# Patient Record
Sex: Female | Born: 1935 | Race: White | Hispanic: No | Smoking: Never smoker
Health system: Southern US, Community
[De-identification: ages and names within clinical notes are randomized; demographics above are authoritative.]

## PROBLEM LIST (undated history)

## (undated) DIAGNOSIS — I10 Essential (primary) hypertension: Secondary | ICD-10-CM

## (undated) DIAGNOSIS — E079 Disorder of thyroid, unspecified: Secondary | ICD-10-CM

## (undated) DIAGNOSIS — L0390 Cellulitis, unspecified: Secondary | ICD-10-CM

## (undated) DIAGNOSIS — N390 Urinary tract infection, site not specified: Secondary | ICD-10-CM

## (undated) DIAGNOSIS — H9190 Unspecified hearing loss, unspecified ear: Secondary | ICD-10-CM

## (undated) HISTORY — PX: CHOLECYSTECTOMY: SHX55

---

## 2015-09-02 DIAGNOSIS — R42 Dizziness and giddiness: Secondary | ICD-10-CM | POA: Diagnosis not present

## 2015-09-02 DIAGNOSIS — R2240 Localized swelling, mass and lump, unspecified lower limb: Secondary | ICD-10-CM | POA: Diagnosis not present

## 2015-09-02 DIAGNOSIS — R410 Disorientation, unspecified: Secondary | ICD-10-CM | POA: Diagnosis not present

## 2015-09-02 DIAGNOSIS — I10 Essential (primary) hypertension: Secondary | ICD-10-CM | POA: Diagnosis not present

## 2015-09-03 DIAGNOSIS — R079 Chest pain, unspecified: Secondary | ICD-10-CM | POA: Diagnosis not present

## 2015-09-03 DIAGNOSIS — Z809 Family history of malignant neoplasm, unspecified: Secondary | ICD-10-CM | POA: Diagnosis not present

## 2015-09-03 DIAGNOSIS — R791 Abnormal coagulation profile: Secondary | ICD-10-CM | POA: Diagnosis not present

## 2015-09-03 DIAGNOSIS — E785 Hyperlipidemia, unspecified: Secondary | ICD-10-CM | POA: Diagnosis not present

## 2015-09-03 DIAGNOSIS — I340 Nonrheumatic mitral (valve) insufficiency: Secondary | ICD-10-CM | POA: Diagnosis not present

## 2015-09-03 DIAGNOSIS — Z7189 Other specified counseling: Secondary | ICD-10-CM | POA: Diagnosis not present

## 2015-09-03 DIAGNOSIS — R531 Weakness: Secondary | ICD-10-CM | POA: Diagnosis not present

## 2015-09-03 DIAGNOSIS — G319 Degenerative disease of nervous system, unspecified: Secondary | ICD-10-CM | POA: Diagnosis not present

## 2015-09-03 DIAGNOSIS — E039 Hypothyroidism, unspecified: Secondary | ICD-10-CM | POA: Diagnosis not present

## 2015-09-03 DIAGNOSIS — I10 Essential (primary) hypertension: Secondary | ICD-10-CM | POA: Diagnosis not present

## 2015-09-03 DIAGNOSIS — M6281 Muscle weakness (generalized): Secondary | ICD-10-CM | POA: Diagnosis not present

## 2015-09-03 DIAGNOSIS — R55 Syncope and collapse: Secondary | ICD-10-CM | POA: Diagnosis not present

## 2015-09-03 DIAGNOSIS — R7989 Other specified abnormal findings of blood chemistry: Secondary | ICD-10-CM | POA: Diagnosis not present

## 2015-09-04 DIAGNOSIS — R55 Syncope and collapse: Secondary | ICD-10-CM | POA: Diagnosis not present

## 2015-09-04 DIAGNOSIS — I6523 Occlusion and stenosis of bilateral carotid arteries: Secondary | ICD-10-CM | POA: Diagnosis not present

## 2015-09-04 DIAGNOSIS — I10 Essential (primary) hypertension: Secondary | ICD-10-CM | POA: Diagnosis not present

## 2015-09-04 DIAGNOSIS — R7989 Other specified abnormal findings of blood chemistry: Secondary | ICD-10-CM | POA: Diagnosis not present

## 2015-09-04 DIAGNOSIS — Z0389 Encounter for observation for other suspected diseases and conditions ruled out: Secondary | ICD-10-CM | POA: Diagnosis not present

## 2015-09-05 DIAGNOSIS — I10 Essential (primary) hypertension: Secondary | ICD-10-CM | POA: Diagnosis not present

## 2015-09-05 DIAGNOSIS — E039 Hypothyroidism, unspecified: Secondary | ICD-10-CM | POA: Diagnosis not present

## 2015-09-05 DIAGNOSIS — R55 Syncope and collapse: Secondary | ICD-10-CM | POA: Diagnosis not present

## 2015-10-12 DIAGNOSIS — N390 Urinary tract infection, site not specified: Secondary | ICD-10-CM | POA: Diagnosis not present

## 2015-10-12 DIAGNOSIS — E039 Hypothyroidism, unspecified: Secondary | ICD-10-CM | POA: Diagnosis not present

## 2015-10-12 DIAGNOSIS — Z0000 Encounter for general adult medical examination without abnormal findings: Secondary | ICD-10-CM | POA: Diagnosis not present

## 2015-10-12 DIAGNOSIS — R739 Hyperglycemia, unspecified: Secondary | ICD-10-CM | POA: Diagnosis not present

## 2015-10-12 DIAGNOSIS — I10 Essential (primary) hypertension: Secondary | ICD-10-CM | POA: Diagnosis not present

## 2015-10-22 DIAGNOSIS — E079 Disorder of thyroid, unspecified: Secondary | ICD-10-CM | POA: Diagnosis not present

## 2015-10-22 DIAGNOSIS — E785 Hyperlipidemia, unspecified: Secondary | ICD-10-CM | POA: Diagnosis not present

## 2015-10-22 DIAGNOSIS — Z79899 Other long term (current) drug therapy: Secondary | ICD-10-CM | POA: Diagnosis not present

## 2015-10-22 DIAGNOSIS — J9811 Atelectasis: Secondary | ICD-10-CM | POA: Diagnosis not present

## 2015-10-22 DIAGNOSIS — R55 Syncope and collapse: Secondary | ICD-10-CM | POA: Diagnosis not present

## 2015-10-22 DIAGNOSIS — I10 Essential (primary) hypertension: Secondary | ICD-10-CM | POA: Diagnosis not present

## 2015-10-22 DIAGNOSIS — R42 Dizziness and giddiness: Secondary | ICD-10-CM | POA: Diagnosis not present

## 2015-11-15 DIAGNOSIS — I10 Essential (primary) hypertension: Secondary | ICD-10-CM | POA: Diagnosis not present

## 2015-11-15 DIAGNOSIS — Z23 Encounter for immunization: Secondary | ICD-10-CM | POA: Diagnosis not present

## 2015-11-22 DIAGNOSIS — I10 Essential (primary) hypertension: Secondary | ICD-10-CM | POA: Diagnosis not present

## 2015-11-22 DIAGNOSIS — E039 Hypothyroidism, unspecified: Secondary | ICD-10-CM | POA: Diagnosis not present

## 2016-07-05 DIAGNOSIS — L0390 Cellulitis, unspecified: Secondary | ICD-10-CM

## 2016-07-05 DIAGNOSIS — N390 Urinary tract infection, site not specified: Secondary | ICD-10-CM

## 2016-07-05 HISTORY — DX: Urinary tract infection, site not specified: N39.0

## 2016-07-05 HISTORY — DX: Cellulitis, unspecified: L03.90

## 2016-07-29 ENCOUNTER — Emergency Department (HOSPITAL_COMMUNITY): Payer: Medicare Other

## 2016-07-29 ENCOUNTER — Encounter (HOSPITAL_COMMUNITY): Payer: Self-pay | Admitting: *Deleted

## 2016-07-29 ENCOUNTER — Inpatient Hospital Stay (HOSPITAL_COMMUNITY)
Admission: EM | Admit: 2016-07-29 | Discharge: 2016-08-01 | DRG: 603 | Disposition: A | Discharge status: Home Health | Payer: Medicare Other | Attending: Oncology | Admitting: Oncology

## 2016-07-29 DIAGNOSIS — L0390 Cellulitis, unspecified: Secondary | ICD-10-CM

## 2016-07-29 DIAGNOSIS — N390 Urinary tract infection, site not specified: Secondary | ICD-10-CM | POA: Diagnosis present

## 2016-07-29 DIAGNOSIS — I10 Essential (primary) hypertension: Secondary | ICD-10-CM | POA: Diagnosis not present

## 2016-07-29 DIAGNOSIS — R0600 Dyspnea, unspecified: Secondary | ICD-10-CM | POA: Diagnosis not present

## 2016-07-29 DIAGNOSIS — Z79899 Other long term (current) drug therapy: Secondary | ICD-10-CM

## 2016-07-29 DIAGNOSIS — L03115 Cellulitis of right lower limb: Secondary | ICD-10-CM | POA: Diagnosis present

## 2016-07-29 DIAGNOSIS — R404 Transient alteration of awareness: Secondary | ICD-10-CM | POA: Diagnosis not present

## 2016-07-29 DIAGNOSIS — L03116 Cellulitis of left lower limb: Principal | ICD-10-CM | POA: Diagnosis present

## 2016-07-29 DIAGNOSIS — R600 Localized edema: Secondary | ICD-10-CM | POA: Diagnosis present

## 2016-07-29 DIAGNOSIS — R531 Weakness: Secondary | ICD-10-CM | POA: Diagnosis not present

## 2016-07-29 HISTORY — DX: Disorder of thyroid, unspecified: E07.9

## 2016-07-29 HISTORY — DX: Essential (primary) hypertension: I10

## 2016-07-29 HISTORY — DX: Cellulitis, unspecified: L03.90

## 2016-07-29 HISTORY — DX: Unspecified hearing loss, unspecified ear: H91.90

## 2016-07-29 HISTORY — DX: Urinary tract infection, site not specified: N39.0

## 2016-07-29 LAB — URINALYSIS, ROUTINE W REFLEX MICROSCOPIC
Bilirubin Urine: NEGATIVE
Glucose, UA: NEGATIVE mg/dL
Ketones, ur: NEGATIVE mg/dL
Nitrite: NEGATIVE
Protein, ur: 30 mg/dL — AB
Specific Gravity, Urine: 1.02 (ref 1.005–1.030)
pH: 5 (ref 5.0–8.0)

## 2016-07-29 LAB — COMPREHENSIVE METABOLIC PANEL
ALT: 7 U/L — ABNORMAL LOW (ref 14–54)
AST: 22 U/L (ref 15–41)
Albumin: 3.3 g/dL — ABNORMAL LOW (ref 3.5–5.0)
Alkaline Phosphatase: 49 U/L (ref 38–126)
Anion gap: 8 (ref 5–15)
BUN: 29 mg/dL — ABNORMAL HIGH (ref 6–20)
CO2: 21 mmol/L — ABNORMAL LOW (ref 22–32)
Calcium: 8.4 mg/dL — ABNORMAL LOW (ref 8.9–10.3)
Chloride: 105 mmol/L (ref 101–111)
Creatinine, Ser: 1.01 mg/dL — ABNORMAL HIGH (ref 0.44–1.00)
GFR calc Af Amer: 54 mL/min — ABNORMAL LOW (ref >60)
GFR calc non Af Amer: 47 mL/min — ABNORMAL LOW (ref >60)
Glucose, Bld: 109 mg/dL — ABNORMAL HIGH (ref 65–99)
Potassium: 4.1 mmol/L (ref 3.5–5.1)
Sodium: 134 mmol/L — ABNORMAL LOW (ref 135–145)
Total Bilirubin: 2 mg/dL — ABNORMAL HIGH (ref 0.3–1.2)
Total Protein: 5.9 g/dL — ABNORMAL LOW (ref 6.5–8.1)

## 2016-07-29 LAB — CBC WITH DIFFERENTIAL/PLATELET
Basophils Absolute: 0 10*3/uL (ref 0.0–0.1)
Basophils Relative: 0 %
Eosinophils Absolute: 0 10*3/uL (ref 0.0–0.7)
Eosinophils Relative: 0 %
HCT: 36.9 % (ref 36.0–46.0)
Hemoglobin: 12.6 g/dL (ref 12.0–15.0)
Lymphocytes Relative: 9 %
Lymphs Abs: 1.6 10*3/uL (ref 0.7–4.0)
MCH: 31 pg (ref 26.0–34.0)
MCHC: 34.1 g/dL (ref 30.0–36.0)
MCV: 90.7 fL (ref 78.0–100.0)
Monocytes Absolute: 0.8 10*3/uL (ref 0.1–1.0)
Monocytes Relative: 5 %
Neutro Abs: 15.1 10*3/uL — ABNORMAL HIGH (ref 1.7–7.7)
Neutrophils Relative %: 86 %
Platelets: 121 10*3/uL — ABNORMAL LOW (ref 150–400)
RBC: 4.07 MIL/uL (ref 3.87–5.11)
RDW: 13.6 % (ref 11.5–15.5)
WBC: 17.5 10*3/uL — ABNORMAL HIGH (ref 4.0–10.5)

## 2016-07-29 LAB — I-STAT CG4 LACTIC ACID, ED
Lactic Acid, Venous: 2.8 mmol/L (ref 0.5–1.9)
Lactic Acid, Venous: 2.28 mmol/L (ref 0.5–1.9)

## 2016-07-29 LAB — I-STAT TROPONIN, ED: Troponin i, poc: 0.01 ng/mL (ref 0.00–0.08)

## 2016-07-29 MED ORDER — ACETAMINOPHEN 650 MG RE SUPP: 650.0000 mg | Freq: Four times a day (QID) | RECTAL | Status: DC | PRN

## 2016-07-29 MED ORDER — SODIUM CHLORIDE 0.9 % IV SOLN
INTRAVENOUS | Status: DC
  Administered 2016-07-29: 21:00:00 via INTRAVENOUS

## 2016-07-29 MED ORDER — SODIUM CHLORIDE 0.9 % IV BOLUS (SEPSIS): 500.0000 mL | Freq: Once | INTRAVENOUS | Status: DC

## 2016-07-29 MED ORDER — AMLODIPINE BESYLATE 5 MG PO TABS
10.0000 mg | ORAL_TABLET | Freq: Every day | ORAL | Status: DC
Start: 2016-07-30 — End: 2016-08-01
  Administered 2016-07-30 – 2016-08-01 (×3): 10 mg via ORAL
  Filled 2016-07-29 (×3): qty 2

## 2016-07-29 MED ORDER — PIPERACILLIN-TAZOBACTAM 3.375 G IVPB: 3.3750 g | Freq: Three times a day (TID) | INTRAVENOUS | Status: DC

## 2016-07-29 MED ORDER — NEBIVOLOL HCL 5 MG PO TABS
5.0000 mg | ORAL_TABLET | Freq: Every day | ORAL | Status: DC
  Administered 2016-07-30 – 2016-08-01 (×3): 5 mg via ORAL
  Filled 2016-07-29 (×3): qty 2
  Filled 2016-07-29 (×3): qty 1

## 2016-07-29 MED ORDER — PIPERACILLIN-TAZOBACTAM 3.375 G IVPB 30 MIN
3.3750 g | Freq: Once | INTRAVENOUS | Status: AC
  Administered 2016-07-29: 3.375 g via INTRAVENOUS
  Filled 2016-07-29: qty 50

## 2016-07-29 MED ORDER — ACETAMINOPHEN 325 MG PO TABS
650.0000 mg | ORAL_TABLET | Freq: Once | ORAL | Status: AC
  Administered 2016-07-29: 650 mg via ORAL
  Filled 2016-07-29: qty 2

## 2016-07-29 MED ORDER — VANCOMYCIN HCL IN DEXTROSE 1-5 GM/200ML-% IV SOLN
1000.0000 mg | Freq: Once | INTRAVENOUS | Status: DC
Start: 2016-07-29 — End: 2016-07-29

## 2016-07-29 MED ORDER — ACETAMINOPHEN 325 MG PO TABS: 650.0000 mg | ORAL_TABLET | Freq: Four times a day (QID) | ORAL | Status: DC | PRN

## 2016-07-29 MED ORDER — VANCOMYCIN HCL IN DEXTROSE 750-5 MG/150ML-% IV SOLN: 750.0000 mg | INTRAVENOUS | Status: DC

## 2016-07-29 MED ORDER — VANCOMYCIN HCL 10 G IV SOLR
1500.0000 mg | Freq: Once | INTRAVENOUS | Status: AC
  Administered 2016-07-29: 1500 mg via INTRAVENOUS
  Filled 2016-07-29 (×2): qty 1500

## 2016-07-29 MED ORDER — SODIUM CHLORIDE 0.9 % IV BOLUS (SEPSIS)
1000.0000 mL | Freq: Once | INTRAVENOUS | Status: AC
  Administered 2016-07-29: 1000 mL via INTRAVENOUS

## 2016-07-29 MED ORDER — ENOXAPARIN SODIUM 40 MG/0.4ML ~~LOC~~ SOLN
40.0000 mg | SUBCUTANEOUS | Status: DC
  Administered 2016-07-29 – 2016-07-31 (×3): 40 mg via SUBCUTANEOUS
  Filled 2016-07-29 (×3): qty 0.4

## 2016-07-29 MED ORDER — DEXTROSE 5 % IV SOLN
1.0000 g | INTRAVENOUS | Status: DC
  Administered 2016-07-29 – 2016-07-30 (×2): 1 g via INTRAVENOUS
  Filled 2016-07-29 (×2): qty 10

## 2016-07-29 MED ORDER — SODIUM CHLORIDE 0.9 % IV SOLN
1000.0000 mL | INTRAVENOUS | Status: DC
  Administered 2016-07-29: 1000 mL via INTRAVENOUS

## 2016-07-29 NOTE — H&P (Signed)
Date: 07/29/2016               Patient Name:  Heidi Daniel MRN: [id number]  DOB: 10/29/1922 Age / Sex: 83 y.o., female   PCP: System, Pcp Not In         Medical Service: Internal Medicine Teaching Service         Attending Physician: Dr. Cyndie ChimeGranfortuna, Genene ChurnJames M, MD    First Contact: Dr. Noemi ChapelBethany Jourdyn Ferrin, DO Pager: [phone]  Second Contact: Dr. Darreld McleanVishal Patel, MD Pager: [phone]       After Hours (After 5p/  First Contact Pager: [phone]  weekends / holidays): Second Contact Pager: [phone]   Chief Complaint: fatigue, weakness  History of Present Illness: This is a very pleasant 83 y/o F with Mhx significant for HTN and chronic dependent LE edema who presents with her son for evaluation of fatigue. The patient reports she feels just fine and is unsure why she is being admitted. She lives with her son who provided the remainder of the history. Son reports she was in her usual state of health until after church on Sunday. She seemed more drowsy than usual and he actually had to help her walk due to weakness. She was unable to get herself out of her recliner without assistance today, prompting him to bring her for evaluation. He notes his mother is in excellent health usually and is able to complete all of her ADLs. The patient herself has no complaints other than a little leg pain and denies any fatigue, chest pain, SOB, cough, abdominal pain, diarrhea, dysuria or increased frequency. Son has noticed increased frequency over the past day.  In the ED she was febrile to 101* per EDP however this has not been recorded. Pulse 86, BP 121/61 and she was saturating 97% on RA. CMET with sodium of 134, albumin of 3.3 and T bilirubin of 2.0. CBC with leukocytosis to 17,500 with neutrophilic predominance. Lactic acid 2.8 > 2.3. CXR without infiltrate or edema. UA with TNTC WBC, large leukocytes, moderate Hgb and rare bacteria. Code sepsis was called and she was subsequently given IV Vancomycin, Zosyn and 2.5L IVF  and admitted to IMTS.   Meds:  Current Meds  Medication Sig  . amLODipine (NORVASC) 10 MG tablet Take 10 mg by mouth daily.  Marland Kitchen. lisinopril (PRINIVIL,ZESTRIL) 20 MG tablet Take 20 mg by mouth daily.  . Multiple Vitamin (MULTIVITAMIN) capsule Take 1 capsule by mouth daily.  . nebivolol (BYSTOLIC) 5 MG tablet Take 5 mg by mouth daily.   Allergies: Allergies as of 07/29/2016  . (No Known Allergies)   Past Medical History:  Diagnosis Date  . Hypertension   . Thyroid disease    Family History: Denies any pertinent family history.   Social History: She has never smoked, drank or used recreational drugs. [place] She has an appointment to establish with a PCP here 6/27.  Review of Systems: A complete ROS was negative except as per HPI.   Physical Exam: Blood pressure (!) 117/50, pulse 76, temperature 98.8 F (37.1 C), temperature source Rectal, resp. rate 17, height 5\' 8"  (1.727 m), weight 175 lb (79.4 kg), SpO2 95 %. General: Very pleasant. Appears YOUNGER than stated age. In no acute distress. Resting comfortably in bed.  HENT: EOMI. No conjunctival injection, icterus or ptosis. Oropharynx clear, mucous membranes moist.  Cardiovascular: Regular rate and rhythm, no gallop or rub appreciated.  Pulmonary: CTA BL with normal WOB on  RA.  Abdomen: Soft, non-tender and non-distended. No guarding or rigidity. +bowel sounds.  Extremities: Equal BL LE edema, mildly pitting. LLE with erythema and increased warmth. No wounds or ulcerations. Non-tender. Well-healed scar medial right ankle.  Skin: Warm, dry. No cyanosis.  Neuro: Strength and sensation grossly intact.  Psych: Stoic. Mood normal and affect was mood congruent. Responds to questions appropriately.   EKG: Pending.  CXR: No acute cardiopulmonary process. Thoracic atherosclerosis   Assessment & Plan by Problem: Principal Problem:   UTI (urinary tract infection) Active Problems:   HTN (hypertension)    Cellulitis  #UTI #LLE Cellulitis, Moderate, non-purulent  This patient is NOT septic. She is HD stable, with normal recorded vital signs. She reportedly had a temp of 101* in ED per EDP however not charted. Leukocytosis to 17,500 and lactic acid initially of 2.8, all of which are natural responses to infection. UA strongly suggestive of infection and her son has noticed increased frequency over the past day. In addition, her LLE is erythematous and warm however without discrete wound, ulceration or lesion. This was just noticed 2-3 days ago per son. She is s/p IV Vancomycin and Zosyn in ED as well as 2.5L of IV fluids. We will de-escalate therapy.  -D/c Vanc + Zosyn -Start IV Ceftriaxone to cover both UTI + cellulitis -Decr maintenance fluids to 58mls/hr overnight -Tylenol PRN -Bcx pending -PT eval -CBC and BMET in am  #HTN BP a touch soft at 114/44. Will hold home Lisinopril 20 mg however will continue Amlodipine 10 mg and Nebivolol 5 mg daily.   #Chronic BL LE Dependent Edema Improved with elevation and compression stockings. Denies any changes. Denies history of HF. She had no TTP of BL LE or knees.  -Keep LE elevated -Strongly doubt DVT, could consider Dopplers  Diet: HH IVF: ns @ 34mls/hr Code status: FULL - this was discussed with both patient and son on admission.  DVT ppx: Lovenox  Dispo: Admit patient to Inpatient with expected length of stay greater than 2 midnights.  SignedNoemi Chapel, DO 07/29/2016, 4:42 PM  Pager: [phone]

## 2016-07-29 NOTE — Progress Notes (Signed)
Pharmacy Antibiotic Note Heidi Daniel is a 83 y.o. female admitted on 07/29/2016 with AMS. Pharmacy consulted to dose vancomycin and zosyn for cellulitis. Afebrile, WBC 17.5, and LA 2.28. SCr 1 for estimated CrCl ~ 35-40 mL/min.   Plan: -Vancomycin 1500 mg IV x1, then 750 mg IV q24hr -Zosyn 3.375g IV q8hr  -Monitor renal function, clinical picture, and cultures -[facility] and as needed (goal 10-15 mcg/mL) -F/u length of therapy    Temp (24hrs), Avg:99.1 F (37.3 C), Min:99.1 F (37.3 C), Max:99.1 F (37.3 C)   Recent Labs Lab 07/29/16 1408 07/29/16 1515 07/29/16 1554  WBC  --  17.5*  --   CREATININE  --  1.01*  --   LATICACIDVEN 2.80*  --  2.28*    Estimated Creatinine Clearance: 39.3 mL/min (A) (by C-G formula based on SCr of 1.01 mg/dL (H)).     Antimicrobials this admission: 6/25 vancomycin > 6/25 zosyn >  Dose adjustments this admission: N/A  Microbiology results: 6/25 blood cx:  York CeriseKatherine Cook, PharmD Pharmacy Resident  Pager [phone] 07/29/16 4:40 PM

## 2016-07-29 NOTE — Progress Notes (Signed)
Pharmacy Antibiotic Note Andi HenceGloria Hubbert is a 83 y.o. female admitted on 07/29/2016 with AMS. All labs pending. Weight estimated since unable to be obtained at the moment.   Plan: -Vancomycin 1500 mg IV x1 -Zosyn 3.375 g IV x1 -F/u SCr for additional maintenance doses    Temp (24hrs), Avg:99.1 F (37.3 C), Min:99.1 F (37.3 C), Max:99.1 F (37.3 C)  No results for input(s): WBC, CREATININE, LATICACIDVEN, VANCOTROUGH, VANCOPEAK, VANCORANDOM, GENTTROUGH, GENTPEAK, GENTRANDOM, TOBRATROUGH, TOBRAPEAK, TOBRARND, AMIKACINPEAK, AMIKACINTROU, AMIKACIN in the last 168 hours.  CrCl cannot be calculated (No order found.).     Antimicrobials this admission: 6/25 vancomycin > 6/25 zosyn >  Dose adjustments this admission: N/A  Microbiology results: 6/25 blood cx:   Baldemar FridayMasters, Welcome Fults M 07/29/2016 1:24 PM

## 2016-07-29 NOTE — ED Notes (Signed)
Elevated CG-4 reported to Dr. Pfieffer 

## 2016-07-29 NOTE — ED Notes (Signed)
Got patient undress on the monitor did vitals patient is resting waiting on provider

## 2016-07-29 NOTE — ED Notes (Signed)
Second RN at bedside attempting IV & blood draw, Pfeiffer, MD informed of need for CBC & CMP, lactic resulted as high & x 1 culture collected, pt difficult stick, MD to start US guided IV if no second access is available

## 2016-07-29 NOTE — ED Provider Notes (Signed)
[facility]-EMERGENCY DEPT Provider Note   CSN: [id number] Arrival date & time: 07/29/16  1159     History   Chief Complaint Chief Complaint  Patient presents with  . Weakness    HPI Heidi Daniel is a 83 y.o. female.  HPI Is 83 year female with baseline is in good health. Baseline she is alert and interactive. Her son reports that starting yesterday she had decreased activity and decreased oral intake. Patient denies any pain complaints. She does not identify any specific problems. Her son however notes that she has not been as alert and active. On exam, notably patient does have lower extremity erythema left lower. This had not been specifically noted by family but patient seems to think it's been present for a while. Past Medical History:  Diagnosis Date  . Hypertension   . Thyroid disease     There are no active problems to display for this patient.   History reviewed. No pertinent surgical history.  OB History    No data available       Home Medications    Prior to Admission medications   Not on File    Family History No family history on file.  Social History Social History  Substance Use Topics  . Smoking status: Never Smoker  . Smokeless tobacco: Never Used  . Alcohol use No     Allergies   Patient has no known allergies.   Review of Systems Review of Systems 10 Systems reviewed and are negative for acute change except as noted in the HPI.   Physical Exam Updated Vital Signs BP (!) 117/50   Pulse 76   Temp 98.8 F (37.1 C) (Rectal)   Resp 17   Ht 5\' 8"  (1.727 m)   Wt 79.4 kg (175 lb)   SpO2 95%   BMI 26.61 kg/m   Physical Exam  Constitutional: She appears well-developed and well-nourished. No distress.  Alert and interactive. No respiratory distress  HENT:  Head: Normocephalic and atraumatic.  Mouth/Throat: Oropharynx is clear and moist.  Eyes: Conjunctivae and EOM are normal.  Neck: Neck supple.  Cardiovascular: Normal rate and  regular rhythm.   No murmur heard. Pulmonary/Chest: Effort normal and breath sounds normal. No respiratory distress.  Abdominal: Soft. There is no tenderness.  Musculoskeletal: She exhibits edema.  Left lower extremity erythema and edema  Neurological: She is alert. No cranial nerve deficit. She exhibits normal muscle tone. Coordination normal.  Skin: Skin is warm and dry.  Psychiatric: She has a normal mood and affect.  Nursing note and vitals reviewed.    ED Treatments / Results  Labs (all labs ordered are listed, but only abnormal results are displayed) Labs Reviewed  COMPREHENSIVE METABOLIC PANEL - Abnormal; Notable for the following:       Result Value   Sodium 134 (*)    CO2 21 (*)    Glucose, Bld 109 (*)    BUN 29 (*)    Creatinine, Ser 1.01 (*)    Calcium 8.4 (*)    Total Protein 5.9 (*)    Albumin 3.3 (*)    ALT 7 (*)    Total Bilirubin 2.0 (*)    GFR calc non Af Amer 47 (*)    GFR calc Af Amer 54 (*)    All other components within normal limits  CBC WITH DIFFERENTIAL/PLATELET - Abnormal; Notable for the following:    WBC 17.5 (*)    Platelets 121 (*)    Neutro Abs 15.1 (*)  All other components within normal limits  URINALYSIS, ROUTINE W REFLEX MICROSCOPIC - Abnormal; Notable for the following:    Color, Urine AMBER (*)    APPearance CLOUDY (*)    Hgb urine dipstick MODERATE (*)    Protein, ur 30 (*)    Leukocytes, UA LARGE (*)    Bacteria, UA RARE (*)    Squamous Epithelial / LPF 6-30 (*)    Non Squamous Epithelial 0-5 (*)    All other components within normal limits  I-STAT CG4 LACTIC ACID, ED - Abnormal; Notable for the following:    Lactic Acid, Venous 2.80 (*)    All other components within normal limits  I-STAT CG4 LACTIC ACID, ED - Abnormal; Notable for the following:    Lactic Acid, Venous 2.28 (*)    All other components within normal limits  CULTURE, BLOOD (ROUTINE X 2)  CULTURE, BLOOD (ROUTINE X 2)  I-STAT TROPOININ, ED    EKG  EKG  Interpretation None       Radiology Dg Chest 2 View  Result Date: 07/29/2016 CLINICAL DATA:  Several days of dyspnea and fever, several month history of lower abdominal and urinary bladder discomfort. History of hypertension and unspecified thyroid disease. EXAM: CHEST  2 VIEW COMPARISON:  None in PACs FINDINGS: The lungs are adequately inflated. There is no focal infiltrate. The interstitial markings are coarse. The heart is top-normal in size. The pulmonary vascularity is normal. There is calcification of the wall of the thoracic aorta. The bony thorax exhibits no acute abnormality. IMPRESSION: There is no acute cardiopulmonary abnormality. There are chronic bronchitic changes. There is thoracic aortic atherosclerosis. Electronically Signed   By: David  SwazilandJordan M.D.   On: 07/29/2016 14:20    Procedures Procedures (including critical care time)  Medications Ordered in ED Medications  0.9 %  sodium chloride infusion (1,000 mLs Intravenous New Bag/Given 07/29/16 1351)  vancomycin (VANCOCIN) 1,500 mg in sodium chloride 0.9 % 500 mL IVPB (1,500 mg Intravenous New Bag/Given 07/29/16 1504)  sodium chloride 0.9 % bolus 1,000 mL (not administered)    And  sodium chloride 0.9 % bolus 1,000 mL (not administered)    And  sodium chloride 0.9 % bolus 500 mL (not administered)  piperacillin-tazobactam (ZOSYN) IVPB 3.375 g (0 g Intravenous Stopped 07/29/16 1504)  acetaminophen (TYLENOL) tablet 650 mg (650 mg Oral Given 07/29/16 1424)     Initial Impression / Assessment and Plan / ED Course  I have reviewed the triage vital signs and the nursing notes.  Pertinent labs & imaging results that were available during my care of the patient were reviewed by me and considered in my medical decision making (see chart for details).     Consult: (14:40) internal medicine teaching service for admission.  Final Clinical Impressions(s) / ED Diagnoses   Final diagnoses:  UTI  Cellulitis    Patient is  elderly female but baseline is in good physical condition. She has had change in activity level with identified leukocytosis, UTI and cellulitis. I had nurses repeat temperatures patient felt warm to touch. Repeat temperature is 101. To start on empiric coverage for cellulitis and UTI with Zosyn and vancomycin. New Prescriptions New Prescriptions   No medications on file     Arby BarrettePfeiffer, Lyndall Bellot, MD 08/09/16 1434

## 2016-07-29 NOTE — ED Triage Notes (Signed)
[facility], per report pts son said she was not herself yesterday, did not eat lunch, & did not go to sleep as usual last night, pt denies pain, Alert to person & place, pt states, " I am 89." unable to state year, EMS reports pt was A&O x4 in route to hospital, pt follows commands, speaks in complete sentences, pt has multiple layers of clothes on upon arrival to ED

## 2016-07-29 NOTE — ED Notes (Signed)
X 2 RNs attempted IV access, IV team consult placed, phlebotomy at bedside attemting blood draw

## 2016-07-30 DIAGNOSIS — N390 Urinary tract infection, site not specified: Secondary | ICD-10-CM

## 2016-07-30 DIAGNOSIS — L03116 Cellulitis of left lower limb: Principal | ICD-10-CM

## 2016-07-30 DIAGNOSIS — I10 Essential (primary) hypertension: Secondary | ICD-10-CM

## 2016-07-30 LAB — BASIC METABOLIC PANEL
Anion gap: 5 (ref 5–15)
BUN: 19 mg/dL (ref 6–20)
CO2: 22 mmol/L (ref 22–32)
Calcium: 8.2 mg/dL — ABNORMAL LOW (ref 8.9–10.3)
Chloride: 110 mmol/L (ref 101–111)
Creatinine, Ser: 0.78 mg/dL (ref 0.44–1.00)
GFR calc Af Amer: >60 mL/min (ref >60)
GFR calc non Af Amer: >60 mL/min (ref >60)
Glucose, Bld: 146 mg/dL — ABNORMAL HIGH (ref 65–99)
Potassium: 3.8 mmol/L (ref 3.5–5.1)
Sodium: 137 mmol/L (ref 135–145)

## 2016-07-30 LAB — CBC
HCT: 37.3 % (ref 36.0–46.0)
Hemoglobin: 12.9 g/dL (ref 12.0–15.0)
MCH: 31.3 pg (ref 26.0–34.0)
MCHC: 34.6 g/dL (ref 30.0–36.0)
MCV: 90.5 fL (ref 78.0–100.0)
Platelets: 129 10*3/uL — ABNORMAL LOW (ref 150–400)
RBC: 4.12 MIL/uL (ref 3.87–5.11)
RDW: 13.4 % (ref 11.5–15.5)
WBC: 13.2 10*3/uL — ABNORMAL HIGH (ref 4.0–10.5)

## 2016-07-30 NOTE — Progress Notes (Signed)
   Subjective: Seen and evaluated today at bedside. Eating breakfast. Feels better. No complaints. Son present at bedside.   Objective:  Vital signs in last 24 hours: Vitals:   07/29/16 1908 07/29/16 2226 07/30/16 0630 07/30/16 0949  BP: (!) 115/46 99/75 (!) 120/52 (!) 133/57  Pulse: 65 68 70   Resp: 17 17 17    Temp: 98.6 F (37 C) 97.8 F (36.6 C) 99.4 F (37.4 C)   TempSrc: Oral Oral    SpO2: 96% 93% 95%   Weight:      Height:       General: Well-appearing elderly woman, appears younger than stated age. In no acute distress. Resting comfortably in bedside chair, eating breakfast. Pleasant and conversant.   Cardiovascular: Regular rate and rhythm. No murmur or rub appreciated. Pulmonary: CTA BL. Unlabored breathing.  Abdomen: Soft, non-tender and non-distended. No guarding or rigidity. +bowel sounds.  Extremities: BL LE edema, equal. Not pitting. LLE erythema improved.  Skin: Warm, dry. No cyanosis.  Psych: Mood normal and affect was mood congruent. Responds to questions appropriately.   Assessment/Plan:  Principal Problem:   UTI (urinary tract infection) Active Problems:   HTN (hypertension)   Cellulitis  #UTI #Cellulitis of LLE, Improving S/p IV Vancomycin and Zosyn from ED. Narrowed this to IV Ceftriaxone on admission. Leukocytosis improved today to 13.2. LEE with improved erythema. Her vital signs have remained stable however had reported fever of 101* last night in ED. Will keep patient on IV Abx until afebrile for at least 24 hours. Will be beneficial to get urine culture and sensitivities as well to further guide her outpatient therapy.  -IV Ceftriaxone -Bcx pending -Ucx not collected by ED. Have requested this as add-on vs new collection -Follow fever and WBC curves -FU PT eval -tylenol PRN fever  #HTN Holding lisinopril as she had soft BP on admission. Continue amlodipine 10 mg and Nebivolol 5 mg daily. -Consider restarting lisinopril should BP  increase  #Chronic BL LE Edema Chronic issue and has remained unchanged. No TTP of BL LE or knees. Denies hx of clots.  -Keep elevated -Doubt DVT as LLE erythema improving with Abx.   Dispo: Anticipated discharge in approximately 1 day(s).   Heidi Skilton, DO 07/30/2016, 10:35 AM Pager: [phone]

## 2016-07-30 NOTE — Evaluation (Signed)
Physical Therapy Evaluation Patient Details Name: Heidi Daniel MRN: [id number] DOB: 1923/01/29 Today's Date: 07/30/2016   History of Present Illness  83 y/o F with Mhx significant for HTN and chronic dependent LE edema who presents with her son for evaluation of fatigue. Admitted for LLE cellulitis and +UTI.  Clinical Impression  Orders received for PT evaluation. Patient demonstrates deficits in functional mobility as indicated below. Will benefit from continued skilled PT to address deficits and maximize function. Will see as indicated and progress as tolerated.  OF NOTE: patient session limited by fatigue, O2 saturations to 88% with increased activity however, unsure accuracy of pleth wave form. Patient denies SOB but does have audible breath sounds with exertion. HR stable     Follow Up Recommendations Home health PT;Supervision/Assistance - 24 hour (if unable to provide 24/7 will need to consider SNF)    Equipment Recommendations  Rolling walker with 5" wheels    Recommendations for Other Services       Precautions / Restrictions Precautions Precautions: Fall      Mobility  Bed Mobility               General bed mobility comments: received in chair  Transfers Overall transfer level: Needs assistance Equipment used: 1 person hand held assist;Rolling walker (2 wheeled) Transfers: Sit to/from [employer] Sit to Stand: Min assist Stand pivot transfers: Min assist       General transfer comment: Min assist for pivot to [facility] for safety and stability, min guard to elevate with RW.  Ambulation/Gait Ambulation/Gait assistance: Min guard Ambulation Distance (Feet): 24 Feet Assistive device: Rolling walker (2 wheeled) Gait Pattern/deviations: Step-to pattern;Decreased stride length;Shuffle;Antalgic;Trunk flexed Gait velocity: Decreased Gait velocity interpretation: <1.8 ft/sec, indicative of risk for recurrent falls General Gait Details: patient ambulated  with RW and min guard for safety. Cues for use of RW and positioning within device. Increased time and effort to perform in room ambulation. limited by fatigue  Stairs            Wheelchair Mobility    Modified Rankin (Stroke Patients Only)       Balance Overall balance assessment: Needs assistance   Sitting balance-Leahy Scale: Good       Standing balance-Leahy Scale: Poor Standing balance comment: heavy reliance on RW for Upright support                             Pertinent Vitals/Pain Pain Assessment: No/denies pain    Home Living Family/patient expects to be discharged to:: Private residence Living Arrangements: Children Available Help at Discharge: Family Type of Home: House Home Access: Ramped entrance     Home Layout: One level Home Equipment: Cane - single point      Prior Function Level of Independence: Independent               Hand Dominance   Dominant Hand: Right    Extremity/Trunk Assessment   Upper Extremity Assessment Upper Extremity Assessment: Generalized weakness    Lower Extremity Assessment Lower Extremity Assessment: Generalized weakness RLE Deficits / Details: 2/5 strength hip flexion, 3/5 knee flexion/ext RLE Coordination: decreased fine motor;decreased gross motor LLE Deficits / Details: increased LLE pitting edema noted, strength 2-/5 hip flexion, 3-/5 knee flexion LLE Coordination: decreased fine motor       Communication      Cognition Arousal/Alertness: Awake/alert Behavior During Therapy: WFL for tasks assessed/performed Overall Cognitive Status: Within Functional Limits for  tasks assessed Area of Impairment: Memory;Safety/judgement;Awareness                     Memory: Decreased short-term memory   Safety/Judgement: Decreased awareness of safety;Decreased awareness of deficits Awareness: Emergent          General Comments General comments (skin integrity, edema, etc.): hygiene and  pericare performed during session    Exercises     Assessment/Plan    PT Assessment Patient needs continued PT services  PT Problem List Decreased strength;Decreased activity tolerance;Decreased mobility;Decreased balance;Decreased coordination;Decreased cognition;Decreased knowledge of precautions;Cardiopulmonary status limiting activity       PT Treatment Interventions DME instruction;Gait training;Functional mobility training;Therapeutic activities;Therapeutic exercise;Balance training;Neuromuscular re-education;Patient/family education    PT Goals (Current goals can be found in the Care Plan section)  Acute Rehab PT Goals Patient Stated Goal: to go home PT Goal Formulation: With patient Time For Goal Achievement: 08/13/16 Potential to Achieve Goals: Good    Frequency Min 3X/week   Barriers to discharge        Co-evaluation               AM-PAC PT "6 Clicks" Daily Activity  Outcome Measure                  End of Session Equipment Utilized During Treatment: Gait belt Activity Tolerance: Patient limited by fatigue;Other (comment) (desaturation to 88% with activity) Patient left: in chair;with call bell/phone within reach;with chair alarm set Nurse Communication: Mobility status PT Visit Diagnosis: Unsteadiness on feet (R26.81);Muscle weakness (generalized) (M62.81)    Time: 1006-1030 PT Time Calculation (min) (ACUTE ONLY): 24 min   Charges:   PT Evaluation $PT Eval Moderate Complexity: 1 Procedure PT Treatments $Therapeutic Activity: 8-22 mins   PT G Codes:        Charlotte Crumbevon Matai Carpenito, PT DPT NCS [phone]   Fabio AsaDevon J Odile Veloso 07/30/2016, 10:38 AM

## 2016-07-31 ENCOUNTER — Encounter (HOSPITAL_COMMUNITY): Payer: Self-pay | Admitting: General Practice

## 2016-07-31 LAB — URINE CULTURE

## 2016-07-31 MED ORDER — WITCH HAZEL-GLYCERIN EX PADS
MEDICATED_PAD | CUTANEOUS | Status: DC | PRN
  Filled 2016-07-31: qty 100

## 2016-07-31 MED ORDER — CEFTRIAXONE SODIUM 1 G IJ SOLR
1.0000 g | INTRAMUSCULAR | Status: DC
  Administered 2016-07-31: 1 g via INTRAVENOUS
  Filled 2016-07-31 (×2): qty 10

## 2016-07-31 NOTE — Progress Notes (Signed)
Patient had scant blood on bedding, hemorrhoids noted on exam, tucks ordered.

## 2016-07-31 NOTE — Progress Notes (Signed)
   Subjective: Seen and evaluated today at bedside. She was resting comfortably and aroused easily. She feels very well and is ready for discharge. No fevers, chills, nausea, vomiting or abdominal pain. Open to HHPT.   Objective:  Vital signs in last 24 hours: Vitals:   07/30/16 0949 07/30/16 1529 07/30/16 2131 07/31/16 0444  BP: (!) 133/57 (!) 116/53 (!) 130/47 125/88  Pulse:  73 75 83  Resp:  18 18 18   Temp:  98.3 F (36.8 C) 98.8 F (37.1 C) 98.6 F (37 C)  TempSrc:  Oral Oral Oral  SpO2:  97% 94% 94%  Weight:      Height:       General: Well-appearing elderly woman, appears younger than stated age. In no acute distress. Resting comfortably. Pleasant and conversant.   Cardiovascular: Regular rate and rhythm. No murmur or rub appreciated. Pulmonary: CTA BL. Unlabored breathing.  Abdomen: Soft, NTND. No guarding or rigidity. +bowel sounds.  Extremities: BL LE edema, equal. LLE erythema with continued improvement. Skin: Warm, dry. No cyanosis.  Psych: Mood normal and affect was mood congruent. Responds to questions appropriately.   Assessment/Plan:  Principal Problem:   UTI (urinary tract infection) Active Problems:   HTN (hypertension)   Cellulitis  #UTI #Cellulitis of LLE, Improving S/p IV Vancomycin and Zosyn from ED. Narrowed this to IV Ceftriaxone on admission. Leukocytosis improved from 17.5 >> 13. LLE continues to improve and she denies any fevers, chills, nausea, vomiting or abdominal pain. She has been afebrile >24 hours. Bcx so far NGTD however ucx still pending -IV Ceftriaxone -Bcx NGTD -UCx pending, would appreciate C&S to narrow treatment for discharge -HH PT + rolling walker  -tylenol PRN fever  #HTN Held lisinopril as she had soft BP on admission. BP have normalized. Continue amlodipine 10 mg and Nebivolol 5 mg daily. -Consider restarting lisinopril should BP increase  #Chronic BL LE Edema No TTP of BL LE or knees. Denies hx of clots.  -Keep  elevated -Doubt DVT as LLE erythema improving with Abx.   Dispo: Anticipated discharge approximately today or tomorrow.   Tilman Mcclaren, DO 07/31/2016, 8:00 AM Pager: [phone]

## 2016-07-31 NOTE — Progress Notes (Signed)
Transitions of Care Pharmacy Note  Plan:  Educated on holding lisinopril 2/2 controlled HTN, follow up with MD outpatient  Addressed concerns regarding BP control Recommend continuing to hold lisinopril while inpatient and BP is <130/80 Outpatient follow-up restart lisinopril --------------------------------------------- Heidi Daniel is an 83 y.o. female who presents with a chief complaint of increased somnolence and decreased functional status. In anticipation of discharge, pharmacy has reviewed this patient's prior to admission medication history, as well as [facility]  Current medication indications, dosing, frequency, and notable side effects reviewed with patient and family. patient and family verbalized understanding of current inpatient medication regimen and are aware that the After Visit Summary when presented, will represent the most accurate medication list at discharge.   Heidi Daniel expressed concerns regarding need for BP medication and importance of BP control. Educated her on goals of care and mechanism of BP meds. Pt's son states that they have a pending appointment in late July with new PCP to establish care.    Assessment: Understanding of regimen: good Understanding of indications: good Potential of compliance: good Barriers to Obtaining Medications: No  Patient instructed to contact inpatient pharmacy team with further questions or concerns if needed.    Time spent preparing for discharge counseling: 10 mins Time spent counseling patient: 10 mins   Thank you for allowing pharmacy to be a part of this patient's care.  Allena Katzaroline E Tehila Sokolow, Pharm.D. PGY1 Pharmacy Resident 6/27/20186:13 PM Pager [phone]

## 2016-07-31 NOTE — Progress Notes (Signed)
Medicine attending: I examined this patient today and I concur with the evaluation and management plan as recorded by resident physician Dr. Toma CopierBethany Molt. She remains afebrile day #3 antibiotics for concomitant urinary tract infection and left lower extremity cellulitis.  Unfortunately, initial urine specimen was contaminated.  We will have to go by her clinical response to antibiotics and she does appear to be responding well.  Blood cultures with no growth at 48 hours. She will receive a third dose of Rocephin today then if otherwise stable transition to oral antibiotics and discharge tomorrow when blood cultures negative at 72 hours. Patient's son present.  Status and plan reviewed.

## 2016-08-01 MED ORDER — SULFAMETHOXAZOLE-TRIMETHOPRIM 400-80 MG PO TABS: 1.0000 | ORAL_TABLET | Freq: Two times a day (BID) | ORAL | 0 refills | Status: AC

## 2016-08-01 NOTE — Discharge Summary (Signed)
Name: Heidi Daniel MRN: [id number] DOB: 10/10/1922 83 y.o. PCP: System, Pcp Not In  Date of Admission: 07/29/2016 11:59 AM Date of Discharge: 08/01/2016 Attending Physician: Levert FeinsteinGranfortuna, James M, MD  Discharge Diagnosis: 1. Urinary tract infection 2. Cellulitis left lower extremity Principal Problem:   UTI (urinary tract infection) Active Problems:   HTN (hypertension)   Cellulitis   Discharge Medications: Allergies as of 08/01/2016   No Known Allergies     Medication List    TAKE these medications   amLODipine 10 MG tablet Commonly known as:  NORVASC Take 10 mg by mouth daily.   lisinopril 20 MG tablet Commonly known as:  PRINIVIL,ZESTRIL Take 20 mg by mouth daily.   multivitamin capsule Take 1 capsule by mouth daily.   nebivolol 5 MG tablet Commonly known as:  BYSTOLIC Take 5 mg by mouth daily.   sulfamethoxazole-trimethoprim 400-80 MG tablet Commonly known as:  BACTRIM Take 1 tablet by mouth 2 (two) times daily. Notes to patient:  Take every 12 hours            Durable Medical Equipment        Start     Ordered   07/31/16 1043  For home use only DME Walker rolling  Once    Question:  Patient needs a walker to treat with the following condition  Answer:  UTI (urinary tract infection)   07/31/16 1042      Disposition and follow-up:   [facility].  At the hospital follow up visit please address:  1.  Urinary tract infection, cellulitis: Ensure compliance and completion of her prescribed seven-day course of Bactrim as well as resolution of her symptoms of fatigue, weakness and left lower extremity erythema.  2.  Labs / imaging needed at time of follow-up: None  3.  Pending labs/ test needing follow-up: None  Follow-up Appointments: Follow-up Information    [facility] up.   Why:  home health services arranged, office to call and set up home  visits Contact information: [address] [place] [id number] [phone]           Hospital Course by problem list: Principal Problem:   UTI (urinary tract infection) Active Problems:   HTN (hypertension)   Cellulitis   Urinary tract infection Cellulitis Very pleasant 83 year old female in excellent health who presents with nonspecific symptoms of fatigue, weakness, lethargy and was found to have a urinary tract infection as well as cellulitis of her left lower extremity. Her leukocytosis was 17,500 and her initial temperature was 99.1 otherwise she was afebrile. She was initially given IV vancomycin and Zosyn by the provider however this was narrowed to Rocephin. Urine culture came back with multiple species and blood cultures remained without growth. She was discharged home with Bactrim to complete a ten-day course of therapy. She lives with her son who is very attentive and we will continue to be her primary caregiver at discharge. She seen by physical therapy who recommended home health physical therapy and this was arranged at time of discharge.  Discharge Vitals:   BP 137/71 (BP Location: Right Arm)   Pulse 78   Temp 98.1 F (36.7 C) (Oral)   Resp 20   Ht 5\' 9"  (1.753 m)   Wt 181 lb (82.1 kg)   SpO2 98%   BMI 26.73 kg/m   Pertinent Labs, Studies, and Procedures:  Urinalysis: Too numerous to count white  blood cells, large leukocytes, rare bacteria. Urine culture: Multiple species Blood cultures no growth to date  Signed: [name], DO 08/01/2016, 2:05 PM   Pager: [phone]

## 2016-08-01 NOTE — Care Management Note (Signed)
Case Management Note  Patient Details  Name: Heidi Daniel MRN: [id number] Date of Birth: 10/14/1922  Subjective/Objective:                    Action/Plan: Plan is to d/c to home with home health services.  Expected Discharge Date:  08/01/16               Expected Discharge Plan:  Home w Home Health Services (Resides with son, Onalee HuaDavid)  In-House Referral:     Discharge planning Services  CM Consult  Post Acute Care Choice:    Choice offered to:  Adult Children  DME Arranged:  Walker rolling DME Agency:  [facility]  [facility]  [facility]  Status of Service:  Completed, signed off  If discussed at Long Length of Stay Meetings, dates discussed:    Additional Comments:  Epifanio LeschesCole, Natara Monfort Hudson, RN 08/01/2016, 11:08 AM

## 2016-08-01 NOTE — Discharge Instructions (Signed)
Ms. Heidi Daniel, you were admitted for a urinary tract infection and an infection of your skin called cellulitis. We treated you with 3 days of IV antibiotics. We are discharging you home with another 7 days of oral antibiotics. Please take SEPTRA/BACTRIM twice daily for 7 days starting THE DAY OF DISCHARGE.   We have also ordered home health physical therapy to work with you in your home. They will call you with arrangements.

## 2016-08-01 NOTE — Progress Notes (Signed)
   Subjective: Seen and evaluated today at bedside. Looks well and is eager for discharge. No fevers or chills. Eating and drinking fine.   Objective:  Vital signs in last 24 hours: Vitals:   07/31/16 1044 07/31/16 1407 07/31/16 2102 08/01/16 0608  BP: (!) 130/56 136/63 (!) 131/59 137/71  Pulse: 82 79 73 78  Resp:  20 19 20   Temp:  98.5 F (36.9 C) 97.5 F (36.4 C) 98.1 F (36.7 C)  TempSrc:  Oral Oral Oral  SpO2: 98% 98% 96% 98%  Weight:      Height:       General: Well-appearing elderly woman, appears younger than stated age. In no acute distress. Resting comfortably in bedside chair. Pleasant and conversant.  Son at bedside.  Cardiovascular: Regular rate and rhythm. No murmur or rub appreciated. Pulmonary: CTA BL. Unlabored breathing.  Abdomen: Soft, NTND. +bowel sounds.  Extremities: BL LE edema, equal. LLE erythema with continued improvement. Skin: Warm, dry. No cyanosis.  Psych: Mood normal and affect was mood congruent. Responds to questions appropriately.   Assessment/Plan:  Principal Problem:   UTI (urinary tract infection) Active Problems:   HTN (hypertension)   Cellulitis  #UTI #Cellulitis of LLE, Improving S/p IV Vancomycin and Zosyn from ED which was narrowed to IV ceftriaxone on admission. She remains afebrile and feels well. She is also clinically improving as well. Feels ready for discharge. Will be discharged home with Bactrim to complete a 10 day course of antibiotics.  -DC with bactrim to complete a 10 day course -BCx no growth -Urine culture with multiple species -HH PT  #HTN Held lisinopril as she had soft BP on admission. BP have normalized. Continue amlodipine 10 mg and Nebivolol 5 mg daily. -Will resume her home regimen at discharge.   #Chronic BL LE Edema  -Keep elevated -Doubt DVT as LLE erythema improving with Abx.   Dispo: Anticipated discharge approximately today.   Aarush Stukey, DO 08/01/2016, 10:29 AM Pager: [phone]

## 2016-08-01 NOTE — Discharge Summary (Signed)
Medicine attending discharge note: I personally examined this patient on the day of discharge and I attest to the accuracy of the discharge evaluation and plan as recorded in the final progress note by resident physician Dr. Noemi ChapelBethany Molt.  Delightful 83 year old woman in overall excellent health who presents with nonspecific symptoms of fatigue, weakness, and lethargy and was found to have a urinary tract infection.  Examination also remarkable for chronic asymmetric peripheral edema 2-3+ left greater than right with an area of extensive cellulitis of the skin left tibial area.  Initial temperature 99.1 otherwise afebrile for the duration of the hospital course.  Initial white blood count 17,500 with 86% neutrophils. She was treated with a course of Rocephin.  Initial urine culture came back contaminated.  Blood cultures remained sterile.  Cellulitis was improving at time of discharge.  No other active issues identified at this time.  She has a very attentive son who was in the hospital daily and will continue to be her primary caregiver after discharge.  Disposition: Condition stable at time of discharge Continue an additional 6 days of oral Septra There were no complications

## 2016-08-01 NOTE — Progress Notes (Signed)
Physical Therapy Treatment Patient Details Name: Heidi Daniel MRN: [id number] DOB: [birth year] Today's Date: 08/01/2016    History of Present Illness 83 y/o F with Mhx significant for HTN and chronic dependent LE edema who presents with her son for evaluation of fatigue. Admitted for LLE cellulitis and +UTI.    PT Comments    Pt seen for mobility progression. Pt with improved tolerance to activity this session. Pt remains a high fall risk and required min A to recover from posterior LOB x2 during session. Pt's son was present throughout session and was able to see the amount of assistance his mother will need upon return home. Pt would continue to benefit from skilled physical therapy services at this time while admitted and after d/c to address the below listed limitations in order to improve overall safety and independence with functional mobility.   Follow Up Recommendations  Home health PT;Supervision/Assistance - 24 hour     Equipment Recommendations  Rolling walker with 5" wheels    Recommendations for Other Services       Precautions / Restrictions Precautions Precautions: Fall Restrictions Weight Bearing Restrictions: No    Mobility  Bed Mobility               General bed mobility comments: received in chair  Transfers Overall transfer level: Needs assistance Equipment used: Rolling walker (2 wheeled) Transfers: Sit to/from Stand Sit to Stand: Min assist         General transfer comment: increased time, vc'ing for hand placement, min A for stability with rise into standing from chair x1 and from [facility] x2. pt initially with posterior LOB upon standing both times.  Ambulation/Gait Ambulation/Gait assistance: Min guard Ambulation Distance (Feet): 40 Feet Assistive device: Rolling walker (2 wheeled) Gait Pattern/deviations: Step-to pattern;Decreased stride length;Shuffle;Trunk flexed Gait velocity: Decreased Gait velocity interpretation: <1.8 ft/sec, indicative  of risk for recurrent falls General Gait Details: pt with minimal to no foot clearance on L during swing phase, dragging foot to advance forwards   Stairs            Wheelchair Mobility    Modified Rankin (Stroke Patients Only)       Balance Overall balance assessment: Needs assistance Sitting-balance support: Feet supported Sitting balance-Leahy Scale: Good     Standing balance support: During functional activity;No upper extremity supported Standing balance-Leahy Scale: Poor Standing balance comment: reliance on RW for dynamic, min A to recovery from posterior LOB x2                             Cognition Arousal/Alertness: Awake/alert Behavior During Therapy: WFL for tasks assessed/performed Overall Cognitive Status: Within Functional Limits for tasks assessed                                        Exercises      General Comments        Pertinent Vitals/Pain Pain Assessment: No/denies pain    Home Living                      Prior Function            PT Goals (current goals can now be found in the care plan section) Acute Rehab PT Goals PT Goal Formulation: With patient Time For Goal Achievement: 08/13/16 Potential to Achieve Goals: Good Progress  towards PT goals: Progressing toward goals    Frequency    Min 3X/week      PT Plan Current plan remains appropriate    Co-evaluation              AM-PAC PT "6 Clicks" Daily Activity  Outcome Measure  Difficulty turning over in bed (including adjusting bedclothes, sheets and blankets)?: A Little Difficulty moving from lying on back to sitting on the side of the bed? : A Little Difficulty sitting down on and standing up from a chair with arms (e.g., wheelchair, bedside commode, etc,.)?: Total Help needed moving to and from a bed to chair (including a wheelchair)?: A Little Help needed walking in hospital room?: A Little Help needed climbing 3-5 steps  with a railing? : A Lot 6 Click Score: 15    End of Session Equipment Utilized During Treatment: Gait belt Activity Tolerance: Patient tolerated treatment well Patient left: in chair;with call bell/phone within reach;with chair alarm set;with family/visitor present Nurse Communication: Mobility status PT Visit Diagnosis: Unsteadiness on feet (R26.81);Muscle weakness (generalized) (M62.81)     Time: [id number] PT Time Calculation (min) (ACUTE ONLY): 27 min  Charges:  $Gait Training: 8-22 mins $Therapeutic Activity: 8-22 mins                    G Codes:       Alcan BorderJennifer Desman [place], [place], [place] [phone]    Alessandra BevelsJennifer M Hisako Bugh 08/01/2016, 11:49 AM

## 2016-08-03 LAB — CULTURE, BLOOD (ROUTINE X 2)
Culture: NO GROWTH
Culture: NO GROWTH
Special Requests: ADEQUATE
Special Requests: ADEQUATE

## 2016-08-05 DIAGNOSIS — R531 Weakness: Secondary | ICD-10-CM | POA: Diagnosis not present

## 2016-08-05 DIAGNOSIS — I10 Essential (primary) hypertension: Secondary | ICD-10-CM | POA: Diagnosis not present

## 2016-08-05 DIAGNOSIS — D72829 Elevated white blood cell count, unspecified: Secondary | ICD-10-CM | POA: Diagnosis not present

## 2016-08-05 DIAGNOSIS — L03116 Cellulitis of left lower limb: Secondary | ICD-10-CM | POA: Diagnosis not present

## 2016-08-05 DIAGNOSIS — E079 Disorder of thyroid, unspecified: Secondary | ICD-10-CM | POA: Diagnosis not present

## 2016-08-05 DIAGNOSIS — N390 Urinary tract infection, site not specified: Secondary | ICD-10-CM | POA: Diagnosis not present

## 2016-08-08 DIAGNOSIS — I10 Essential (primary) hypertension: Secondary | ICD-10-CM | POA: Diagnosis not present

## 2016-08-08 DIAGNOSIS — E079 Disorder of thyroid, unspecified: Secondary | ICD-10-CM | POA: Diagnosis not present

## 2016-08-08 DIAGNOSIS — L03116 Cellulitis of left lower limb: Secondary | ICD-10-CM | POA: Diagnosis not present

## 2016-08-08 DIAGNOSIS — R531 Weakness: Secondary | ICD-10-CM | POA: Diagnosis not present

## 2016-08-08 DIAGNOSIS — D72829 Elevated white blood cell count, unspecified: Secondary | ICD-10-CM | POA: Diagnosis not present

## 2016-08-08 DIAGNOSIS — N390 Urinary tract infection, site not specified: Secondary | ICD-10-CM | POA: Diagnosis not present

## 2016-08-09 DIAGNOSIS — L03116 Cellulitis of left lower limb: Secondary | ICD-10-CM | POA: Diagnosis not present

## 2016-08-09 DIAGNOSIS — R531 Weakness: Secondary | ICD-10-CM | POA: Diagnosis not present

## 2016-08-09 DIAGNOSIS — I10 Essential (primary) hypertension: Secondary | ICD-10-CM | POA: Diagnosis not present

## 2016-08-09 DIAGNOSIS — E079 Disorder of thyroid, unspecified: Secondary | ICD-10-CM | POA: Diagnosis not present

## 2016-08-09 DIAGNOSIS — D72829 Elevated white blood cell count, unspecified: Secondary | ICD-10-CM | POA: Diagnosis not present

## 2016-08-09 DIAGNOSIS — N390 Urinary tract infection, site not specified: Secondary | ICD-10-CM | POA: Diagnosis not present

## 2016-08-12 DIAGNOSIS — I10 Essential (primary) hypertension: Secondary | ICD-10-CM | POA: Diagnosis not present

## 2016-08-12 DIAGNOSIS — N390 Urinary tract infection, site not specified: Secondary | ICD-10-CM | POA: Diagnosis not present

## 2016-08-12 DIAGNOSIS — L03116 Cellulitis of left lower limb: Secondary | ICD-10-CM | POA: Diagnosis not present

## 2016-08-12 DIAGNOSIS — E079 Disorder of thyroid, unspecified: Secondary | ICD-10-CM | POA: Diagnosis not present

## 2016-08-12 DIAGNOSIS — R531 Weakness: Secondary | ICD-10-CM | POA: Diagnosis not present

## 2016-08-12 DIAGNOSIS — D72829 Elevated white blood cell count, unspecified: Secondary | ICD-10-CM | POA: Diagnosis not present

## 2016-08-14 DIAGNOSIS — R531 Weakness: Secondary | ICD-10-CM | POA: Diagnosis not present

## 2016-08-14 DIAGNOSIS — I10 Essential (primary) hypertension: Secondary | ICD-10-CM | POA: Diagnosis not present

## 2016-08-14 DIAGNOSIS — D72829 Elevated white blood cell count, unspecified: Secondary | ICD-10-CM | POA: Diagnosis not present

## 2016-08-14 DIAGNOSIS — L03116 Cellulitis of left lower limb: Secondary | ICD-10-CM | POA: Diagnosis not present

## 2016-08-14 DIAGNOSIS — E079 Disorder of thyroid, unspecified: Secondary | ICD-10-CM | POA: Diagnosis not present

## 2016-08-14 DIAGNOSIS — N390 Urinary tract infection, site not specified: Secondary | ICD-10-CM | POA: Diagnosis not present

## 2016-08-15 DIAGNOSIS — I10 Essential (primary) hypertension: Secondary | ICD-10-CM | POA: Diagnosis not present

## 2016-08-15 DIAGNOSIS — D72829 Elevated white blood cell count, unspecified: Secondary | ICD-10-CM | POA: Diagnosis not present

## 2016-08-15 DIAGNOSIS — R531 Weakness: Secondary | ICD-10-CM | POA: Diagnosis not present

## 2016-08-15 DIAGNOSIS — L03116 Cellulitis of left lower limb: Secondary | ICD-10-CM | POA: Diagnosis not present

## 2016-08-15 DIAGNOSIS — N390 Urinary tract infection, site not specified: Secondary | ICD-10-CM | POA: Diagnosis not present

## 2016-08-15 DIAGNOSIS — E079 Disorder of thyroid, unspecified: Secondary | ICD-10-CM | POA: Diagnosis not present

## 2016-08-16 DIAGNOSIS — D72829 Elevated white blood cell count, unspecified: Secondary | ICD-10-CM | POA: Diagnosis not present

## 2016-08-16 DIAGNOSIS — R531 Weakness: Secondary | ICD-10-CM | POA: Diagnosis not present

## 2016-08-16 DIAGNOSIS — L03116 Cellulitis of left lower limb: Secondary | ICD-10-CM | POA: Diagnosis not present

## 2016-08-16 DIAGNOSIS — N390 Urinary tract infection, site not specified: Secondary | ICD-10-CM | POA: Diagnosis not present

## 2016-08-16 DIAGNOSIS — I10 Essential (primary) hypertension: Secondary | ICD-10-CM | POA: Diagnosis not present

## 2016-08-16 DIAGNOSIS — E079 Disorder of thyroid, unspecified: Secondary | ICD-10-CM | POA: Diagnosis not present

## 2016-08-19 DIAGNOSIS — I10 Essential (primary) hypertension: Secondary | ICD-10-CM | POA: Diagnosis not present

## 2016-08-19 DIAGNOSIS — R531 Weakness: Secondary | ICD-10-CM | POA: Diagnosis not present

## 2016-08-19 DIAGNOSIS — N390 Urinary tract infection, site not specified: Secondary | ICD-10-CM | POA: Diagnosis not present

## 2016-08-19 DIAGNOSIS — E079 Disorder of thyroid, unspecified: Secondary | ICD-10-CM | POA: Diagnosis not present

## 2016-08-19 DIAGNOSIS — L03116 Cellulitis of left lower limb: Secondary | ICD-10-CM | POA: Diagnosis not present

## 2016-08-19 DIAGNOSIS — D72829 Elevated white blood cell count, unspecified: Secondary | ICD-10-CM | POA: Diagnosis not present

## 2016-08-20 DIAGNOSIS — E079 Disorder of thyroid, unspecified: Secondary | ICD-10-CM | POA: Diagnosis not present

## 2016-08-20 DIAGNOSIS — R531 Weakness: Secondary | ICD-10-CM | POA: Diagnosis not present

## 2016-08-20 DIAGNOSIS — I10 Essential (primary) hypertension: Secondary | ICD-10-CM | POA: Diagnosis not present

## 2016-08-20 DIAGNOSIS — L03116 Cellulitis of left lower limb: Secondary | ICD-10-CM | POA: Diagnosis not present

## 2016-08-20 DIAGNOSIS — N390 Urinary tract infection, site not specified: Secondary | ICD-10-CM | POA: Diagnosis not present

## 2016-08-20 DIAGNOSIS — D72829 Elevated white blood cell count, unspecified: Secondary | ICD-10-CM | POA: Diagnosis not present

## 2016-08-22 DIAGNOSIS — R531 Weakness: Secondary | ICD-10-CM | POA: Diagnosis not present

## 2016-08-22 DIAGNOSIS — L03116 Cellulitis of left lower limb: Secondary | ICD-10-CM | POA: Diagnosis not present

## 2016-08-22 DIAGNOSIS — I10 Essential (primary) hypertension: Secondary | ICD-10-CM | POA: Diagnosis not present

## 2016-08-22 DIAGNOSIS — N390 Urinary tract infection, site not specified: Secondary | ICD-10-CM | POA: Diagnosis not present

## 2016-08-22 DIAGNOSIS — D72829 Elevated white blood cell count, unspecified: Secondary | ICD-10-CM | POA: Diagnosis not present

## 2016-08-22 DIAGNOSIS — E079 Disorder of thyroid, unspecified: Secondary | ICD-10-CM | POA: Diagnosis not present

## 2016-08-28 DIAGNOSIS — R600 Localized edema: Secondary | ICD-10-CM | POA: Diagnosis not present

## 2016-08-28 DIAGNOSIS — R5383 Other fatigue: Secondary | ICD-10-CM | POA: Diagnosis not present

## 2016-08-28 DIAGNOSIS — N390 Urinary tract infection, site not specified: Secondary | ICD-10-CM | POA: Diagnosis not present

## 2016-08-28 DIAGNOSIS — I10 Essential (primary) hypertension: Secondary | ICD-10-CM | POA: Diagnosis not present

## 2016-08-29 DIAGNOSIS — E079 Disorder of thyroid, unspecified: Secondary | ICD-10-CM | POA: Diagnosis not present

## 2016-08-29 DIAGNOSIS — R531 Weakness: Secondary | ICD-10-CM | POA: Diagnosis not present

## 2016-08-29 DIAGNOSIS — D72829 Elevated white blood cell count, unspecified: Secondary | ICD-10-CM | POA: Diagnosis not present

## 2016-08-29 DIAGNOSIS — I10 Essential (primary) hypertension: Secondary | ICD-10-CM | POA: Diagnosis not present

## 2016-08-29 DIAGNOSIS — N390 Urinary tract infection, site not specified: Secondary | ICD-10-CM | POA: Diagnosis not present

## 2016-08-29 DIAGNOSIS — L03116 Cellulitis of left lower limb: Secondary | ICD-10-CM | POA: Diagnosis not present

## 2016-08-30 DIAGNOSIS — D72829 Elevated white blood cell count, unspecified: Secondary | ICD-10-CM | POA: Diagnosis not present

## 2016-08-30 DIAGNOSIS — I10 Essential (primary) hypertension: Secondary | ICD-10-CM | POA: Diagnosis not present

## 2016-08-30 DIAGNOSIS — E079 Disorder of thyroid, unspecified: Secondary | ICD-10-CM | POA: Diagnosis not present

## 2016-08-30 DIAGNOSIS — N390 Urinary tract infection, site not specified: Secondary | ICD-10-CM | POA: Diagnosis not present

## 2016-08-30 DIAGNOSIS — R531 Weakness: Secondary | ICD-10-CM | POA: Diagnosis not present

## 2016-08-30 DIAGNOSIS — L03116 Cellulitis of left lower limb: Secondary | ICD-10-CM | POA: Diagnosis not present

## 2016-09-03 DIAGNOSIS — N390 Urinary tract infection, site not specified: Secondary | ICD-10-CM | POA: Diagnosis not present

## 2016-09-03 DIAGNOSIS — E079 Disorder of thyroid, unspecified: Secondary | ICD-10-CM | POA: Diagnosis not present

## 2016-09-03 DIAGNOSIS — L03116 Cellulitis of left lower limb: Secondary | ICD-10-CM | POA: Diagnosis not present

## 2016-09-03 DIAGNOSIS — I10 Essential (primary) hypertension: Secondary | ICD-10-CM | POA: Diagnosis not present

## 2016-09-03 DIAGNOSIS — D72829 Elevated white blood cell count, unspecified: Secondary | ICD-10-CM | POA: Diagnosis not present

## 2016-09-03 DIAGNOSIS — R531 Weakness: Secondary | ICD-10-CM | POA: Diagnosis not present

## 2016-09-05 DIAGNOSIS — D72829 Elevated white blood cell count, unspecified: Secondary | ICD-10-CM | POA: Diagnosis not present

## 2016-09-05 DIAGNOSIS — I10 Essential (primary) hypertension: Secondary | ICD-10-CM | POA: Diagnosis not present

## 2016-09-05 DIAGNOSIS — N390 Urinary tract infection, site not specified: Secondary | ICD-10-CM | POA: Diagnosis not present

## 2016-09-05 DIAGNOSIS — E079 Disorder of thyroid, unspecified: Secondary | ICD-10-CM | POA: Diagnosis not present

## 2016-09-05 DIAGNOSIS — R531 Weakness: Secondary | ICD-10-CM | POA: Diagnosis not present

## 2016-09-05 DIAGNOSIS — L03116 Cellulitis of left lower limb: Secondary | ICD-10-CM | POA: Diagnosis not present

## 2017-02-05 DIAGNOSIS — R2689 Other abnormalities of gait and mobility: Secondary | ICD-10-CM | POA: Diagnosis not present

## 2017-02-05 DIAGNOSIS — I10 Essential (primary) hypertension: Secondary | ICD-10-CM | POA: Diagnosis not present

## 2017-02-05 DIAGNOSIS — R5383 Other fatigue: Secondary | ICD-10-CM | POA: Diagnosis not present

## 2017-02-05 DIAGNOSIS — Z23 Encounter for immunization: Secondary | ICD-10-CM | POA: Diagnosis not present

## 2017-02-05 DIAGNOSIS — R609 Edema, unspecified: Secondary | ICD-10-CM | POA: Diagnosis not present

## 2017-02-06 DIAGNOSIS — N390 Urinary tract infection, site not specified: Secondary | ICD-10-CM | POA: Diagnosis not present

## 2017-02-09 DIAGNOSIS — I10 Essential (primary) hypertension: Secondary | ICD-10-CM | POA: Diagnosis not present

## 2017-02-09 DIAGNOSIS — R609 Edema, unspecified: Secondary | ICD-10-CM | POA: Diagnosis not present

## 2017-02-09 DIAGNOSIS — N390 Urinary tract infection, site not specified: Secondary | ICD-10-CM | POA: Diagnosis not present

## 2017-02-09 DIAGNOSIS — R2689 Other abnormalities of gait and mobility: Secondary | ICD-10-CM | POA: Diagnosis not present

## 2017-02-10 DIAGNOSIS — N390 Urinary tract infection, site not specified: Secondary | ICD-10-CM | POA: Diagnosis not present

## 2017-02-10 DIAGNOSIS — I10 Essential (primary) hypertension: Secondary | ICD-10-CM | POA: Diagnosis not present

## 2017-02-10 DIAGNOSIS — R2689 Other abnormalities of gait and mobility: Secondary | ICD-10-CM | POA: Diagnosis not present

## 2017-02-10 DIAGNOSIS — R609 Edema, unspecified: Secondary | ICD-10-CM | POA: Diagnosis not present

## 2017-02-19 DIAGNOSIS — N390 Urinary tract infection, site not specified: Secondary | ICD-10-CM | POA: Diagnosis not present

## 2017-02-19 DIAGNOSIS — R2689 Other abnormalities of gait and mobility: Secondary | ICD-10-CM | POA: Diagnosis not present

## 2017-02-19 DIAGNOSIS — I10 Essential (primary) hypertension: Secondary | ICD-10-CM | POA: Diagnosis not present

## 2017-02-19 DIAGNOSIS — R609 Edema, unspecified: Secondary | ICD-10-CM | POA: Diagnosis not present

## 2017-02-21 DIAGNOSIS — R609 Edema, unspecified: Secondary | ICD-10-CM | POA: Diagnosis not present

## 2017-02-21 DIAGNOSIS — N390 Urinary tract infection, site not specified: Secondary | ICD-10-CM | POA: Diagnosis not present

## 2017-02-21 DIAGNOSIS — R2689 Other abnormalities of gait and mobility: Secondary | ICD-10-CM | POA: Diagnosis not present

## 2017-02-21 DIAGNOSIS — I10 Essential (primary) hypertension: Secondary | ICD-10-CM | POA: Diagnosis not present

## 2017-02-26 DIAGNOSIS — R2689 Other abnormalities of gait and mobility: Secondary | ICD-10-CM | POA: Diagnosis not present

## 2017-02-26 DIAGNOSIS — I10 Essential (primary) hypertension: Secondary | ICD-10-CM | POA: Diagnosis not present

## 2017-02-26 DIAGNOSIS — N390 Urinary tract infection, site not specified: Secondary | ICD-10-CM | POA: Diagnosis not present

## 2017-02-26 DIAGNOSIS — R609 Edema, unspecified: Secondary | ICD-10-CM | POA: Diagnosis not present

## 2017-02-28 DIAGNOSIS — I10 Essential (primary) hypertension: Secondary | ICD-10-CM | POA: Diagnosis not present

## 2017-02-28 DIAGNOSIS — R2689 Other abnormalities of gait and mobility: Secondary | ICD-10-CM | POA: Diagnosis not present

## 2017-02-28 DIAGNOSIS — N390 Urinary tract infection, site not specified: Secondary | ICD-10-CM | POA: Diagnosis not present

## 2017-02-28 DIAGNOSIS — R609 Edema, unspecified: Secondary | ICD-10-CM | POA: Diagnosis not present

## 2017-03-05 DIAGNOSIS — I10 Essential (primary) hypertension: Secondary | ICD-10-CM | POA: Diagnosis not present

## 2017-03-05 DIAGNOSIS — R609 Edema, unspecified: Secondary | ICD-10-CM | POA: Diagnosis not present

## 2017-03-05 DIAGNOSIS — N390 Urinary tract infection, site not specified: Secondary | ICD-10-CM | POA: Diagnosis not present

## 2017-03-05 DIAGNOSIS — R2689 Other abnormalities of gait and mobility: Secondary | ICD-10-CM | POA: Diagnosis not present

## 2017-03-06 DIAGNOSIS — R609 Edema, unspecified: Secondary | ICD-10-CM | POA: Diagnosis not present

## 2017-03-06 DIAGNOSIS — I10 Essential (primary) hypertension: Secondary | ICD-10-CM | POA: Diagnosis not present

## 2017-03-06 DIAGNOSIS — R2689 Other abnormalities of gait and mobility: Secondary | ICD-10-CM | POA: Diagnosis not present

## 2017-03-06 DIAGNOSIS — N390 Urinary tract infection, site not specified: Secondary | ICD-10-CM | POA: Diagnosis not present

## 2017-03-12 DIAGNOSIS — R609 Edema, unspecified: Secondary | ICD-10-CM | POA: Diagnosis not present

## 2017-03-12 DIAGNOSIS — I10 Essential (primary) hypertension: Secondary | ICD-10-CM | POA: Diagnosis not present

## 2017-03-12 DIAGNOSIS — R2689 Other abnormalities of gait and mobility: Secondary | ICD-10-CM | POA: Diagnosis not present

## 2017-03-12 DIAGNOSIS — N390 Urinary tract infection, site not specified: Secondary | ICD-10-CM | POA: Diagnosis not present

## 2017-03-13 DIAGNOSIS — R609 Edema, unspecified: Secondary | ICD-10-CM | POA: Diagnosis not present

## 2017-03-13 DIAGNOSIS — N390 Urinary tract infection, site not specified: Secondary | ICD-10-CM | POA: Diagnosis not present

## 2017-03-13 DIAGNOSIS — R2689 Other abnormalities of gait and mobility: Secondary | ICD-10-CM | POA: Diagnosis not present

## 2017-03-13 DIAGNOSIS — I10 Essential (primary) hypertension: Secondary | ICD-10-CM | POA: Diagnosis not present

## 2017-03-19 DIAGNOSIS — R609 Edema, unspecified: Secondary | ICD-10-CM | POA: Diagnosis not present

## 2017-03-19 DIAGNOSIS — I10 Essential (primary) hypertension: Secondary | ICD-10-CM | POA: Diagnosis not present

## 2017-03-19 DIAGNOSIS — N390 Urinary tract infection, site not specified: Secondary | ICD-10-CM | POA: Diagnosis not present

## 2017-03-19 DIAGNOSIS — R2689 Other abnormalities of gait and mobility: Secondary | ICD-10-CM | POA: Diagnosis not present

## 2017-03-20 DIAGNOSIS — R609 Edema, unspecified: Secondary | ICD-10-CM | POA: Diagnosis not present

## 2017-03-20 DIAGNOSIS — R2689 Other abnormalities of gait and mobility: Secondary | ICD-10-CM | POA: Diagnosis not present

## 2017-03-20 DIAGNOSIS — N390 Urinary tract infection, site not specified: Secondary | ICD-10-CM | POA: Diagnosis not present

## 2017-03-20 DIAGNOSIS — I10 Essential (primary) hypertension: Secondary | ICD-10-CM | POA: Diagnosis not present

## 2017-03-26 DIAGNOSIS — R2689 Other abnormalities of gait and mobility: Secondary | ICD-10-CM | POA: Diagnosis not present

## 2017-03-26 DIAGNOSIS — I10 Essential (primary) hypertension: Secondary | ICD-10-CM | POA: Diagnosis not present

## 2017-03-26 DIAGNOSIS — R609 Edema, unspecified: Secondary | ICD-10-CM | POA: Diagnosis not present

## 2017-03-26 DIAGNOSIS — N390 Urinary tract infection, site not specified: Secondary | ICD-10-CM | POA: Diagnosis not present

## 2017-03-27 DIAGNOSIS — N390 Urinary tract infection, site not specified: Secondary | ICD-10-CM | POA: Diagnosis not present

## 2017-03-27 DIAGNOSIS — I10 Essential (primary) hypertension: Secondary | ICD-10-CM | POA: Diagnosis not present

## 2017-03-27 DIAGNOSIS — R2689 Other abnormalities of gait and mobility: Secondary | ICD-10-CM | POA: Diagnosis not present

## 2017-03-27 DIAGNOSIS — R609 Edema, unspecified: Secondary | ICD-10-CM | POA: Diagnosis not present

## 2017-04-01 DIAGNOSIS — R600 Localized edema: Secondary | ICD-10-CM | POA: Diagnosis not present

## 2017-04-01 DIAGNOSIS — I10 Essential (primary) hypertension: Secondary | ICD-10-CM | POA: Diagnosis not present

## 2017-04-02 DIAGNOSIS — I10 Essential (primary) hypertension: Secondary | ICD-10-CM | POA: Diagnosis not present

## 2017-04-02 DIAGNOSIS — N390 Urinary tract infection, site not specified: Secondary | ICD-10-CM | POA: Diagnosis not present

## 2017-04-02 DIAGNOSIS — R2689 Other abnormalities of gait and mobility: Secondary | ICD-10-CM | POA: Diagnosis not present

## 2017-04-02 DIAGNOSIS — R609 Edema, unspecified: Secondary | ICD-10-CM | POA: Diagnosis not present

## 2017-04-03 DIAGNOSIS — I10 Essential (primary) hypertension: Secondary | ICD-10-CM | POA: Diagnosis not present

## 2017-04-03 DIAGNOSIS — N390 Urinary tract infection, site not specified: Secondary | ICD-10-CM | POA: Diagnosis not present

## 2017-04-03 DIAGNOSIS — R609 Edema, unspecified: Secondary | ICD-10-CM | POA: Diagnosis not present

## 2017-04-03 DIAGNOSIS — R2689 Other abnormalities of gait and mobility: Secondary | ICD-10-CM | POA: Diagnosis not present

## 2017-04-09 DIAGNOSIS — R609 Edema, unspecified: Secondary | ICD-10-CM | POA: Diagnosis not present

## 2017-04-09 DIAGNOSIS — R2689 Other abnormalities of gait and mobility: Secondary | ICD-10-CM | POA: Diagnosis not present

## 2017-04-09 DIAGNOSIS — I10 Essential (primary) hypertension: Secondary | ICD-10-CM | POA: Diagnosis not present

## 2017-04-09 DIAGNOSIS — N390 Urinary tract infection, site not specified: Secondary | ICD-10-CM | POA: Diagnosis not present

## 2017-04-25 DIAGNOSIS — I10 Essential (primary) hypertension: Secondary | ICD-10-CM | POA: Diagnosis not present

## 2017-04-25 DIAGNOSIS — R600 Localized edema: Secondary | ICD-10-CM | POA: Diagnosis not present

## 2017-08-05 DIAGNOSIS — R197 Diarrhea, unspecified: Secondary | ICD-10-CM | POA: Diagnosis not present

## 2017-08-05 DIAGNOSIS — I10 Essential (primary) hypertension: Secondary | ICD-10-CM | POA: Diagnosis not present

## 2017-08-06 DIAGNOSIS — R197 Diarrhea, unspecified: Secondary | ICD-10-CM | POA: Diagnosis not present

## 2017-09-22 DIAGNOSIS — R100 Acute abdomen: Secondary | ICD-10-CM | POA: Diagnosis not present

## 2017-09-22 DIAGNOSIS — R2689 Other abnormalities of gait and mobility: Secondary | ICD-10-CM | POA: Diagnosis not present

## 2017-09-22 DIAGNOSIS — R609 Edema, unspecified: Secondary | ICD-10-CM | POA: Diagnosis not present

## 2018-03-06 DIAGNOSIS — R05 Cough: Secondary | ICD-10-CM | POA: Diagnosis not present

## 2018-03-06 DIAGNOSIS — I10 Essential (primary) hypertension: Secondary | ICD-10-CM | POA: Diagnosis not present

## 2018-03-06 DIAGNOSIS — J988 Other specified respiratory disorders: Secondary | ICD-10-CM | POA: Diagnosis not present

## 2018-03-20 DIAGNOSIS — Z23 Encounter for immunization: Secondary | ICD-10-CM | POA: Diagnosis not present

## 2018-03-20 DIAGNOSIS — J189 Pneumonia, unspecified organism: Secondary | ICD-10-CM | POA: Diagnosis not present

## 2018-04-17 DIAGNOSIS — I10 Essential (primary) hypertension: Secondary | ICD-10-CM | POA: Diagnosis not present

## 2018-04-17 DIAGNOSIS — Z0000 Encounter for general adult medical examination without abnormal findings: Secondary | ICD-10-CM | POA: Diagnosis not present

## 2018-04-17 DIAGNOSIS — J181 Lobar pneumonia, unspecified organism: Secondary | ICD-10-CM | POA: Diagnosis not present

## 2018-04-17 DIAGNOSIS — R5383 Other fatigue: Secondary | ICD-10-CM | POA: Diagnosis not present

## 2018-04-17 DIAGNOSIS — J189 Pneumonia, unspecified organism: Secondary | ICD-10-CM | POA: Diagnosis not present

## 2018-11-25 DIAGNOSIS — R5383 Other fatigue: Secondary | ICD-10-CM | POA: Diagnosis not present

## 2018-11-25 DIAGNOSIS — R600 Localized edema: Secondary | ICD-10-CM | POA: Diagnosis not present

## 2018-11-25 DIAGNOSIS — I10 Essential (primary) hypertension: Secondary | ICD-10-CM | POA: Diagnosis not present

## 2019-02-02 DIAGNOSIS — I10 Essential (primary) hypertension: Secondary | ICD-10-CM | POA: Diagnosis not present

## 2019-02-03 DIAGNOSIS — I10 Essential (primary) hypertension: Secondary | ICD-10-CM | POA: Diagnosis not present

## 2019-02-18 DIAGNOSIS — I10 Essential (primary) hypertension: Secondary | ICD-10-CM | POA: Diagnosis not present

## 2019-02-21 DIAGNOSIS — I10 Essential (primary) hypertension: Secondary | ICD-10-CM | POA: Diagnosis not present

## 2019-02-22 DIAGNOSIS — N390 Urinary tract infection, site not specified: Secondary | ICD-10-CM | POA: Diagnosis not present

## 2019-02-24 DIAGNOSIS — I10 Essential (primary) hypertension: Secondary | ICD-10-CM | POA: Diagnosis not present

## 2019-02-26 DIAGNOSIS — I10 Essential (primary) hypertension: Secondary | ICD-10-CM | POA: Diagnosis not present

## 2019-03-02 DIAGNOSIS — I10 Essential (primary) hypertension: Secondary | ICD-10-CM | POA: Diagnosis not present

## 2019-03-04 DIAGNOSIS — I10 Essential (primary) hypertension: Secondary | ICD-10-CM | POA: Diagnosis not present

## 2019-03-05 DIAGNOSIS — I10 Essential (primary) hypertension: Secondary | ICD-10-CM | POA: Diagnosis not present

## 2019-03-09 DIAGNOSIS — I10 Essential (primary) hypertension: Secondary | ICD-10-CM | POA: Diagnosis not present

## 2019-03-11 DIAGNOSIS — I10 Essential (primary) hypertension: Secondary | ICD-10-CM | POA: Diagnosis not present

## 2019-03-16 DIAGNOSIS — I10 Essential (primary) hypertension: Secondary | ICD-10-CM | POA: Diagnosis not present

## 2019-03-19 DIAGNOSIS — I10 Essential (primary) hypertension: Secondary | ICD-10-CM | POA: Diagnosis not present

## 2019-04-01 ENCOUNTER — Ambulatory Visit: Payer: Medicare Other | Attending: Internal Medicine

## 2019-04-01 DIAGNOSIS — Z23 Encounter for immunization: Secondary | ICD-10-CM

## 2019-04-01 NOTE — Progress Notes (Signed)
   Covid-19 Vaccination Clinic  Name:  Heidi Daniel    MRN: [id number] DOB: 01/03/1923  04/01/2019  [name] was observed post Covid-19 immunization for 15 minutes without incidence. She was provided with Vaccine Information Sheet and instruction to access the V-Safe system.   [name] was instructed to call 911 with any severe reactions post vaccine: Marland Kitchen Difficulty breathing  . Swelling of your face and throat  . A fast heartbeat  . A bad rash all over your body  . Dizziness and weakness    Immunizations Administered    Name Date Dose VIS Date Route   Pfizer COVID-19 Vaccine 04/01/2019  4:41 PM 0.3 mL 01/15/2019 Intramuscular   Manufacturer: [employer], [employer]   Lot: [id number]   NDC: [address]-0

## 2019-04-13 DIAGNOSIS — L089 Local infection of the skin and subcutaneous tissue, unspecified: Secondary | ICD-10-CM | POA: Diagnosis not present

## 2019-04-13 DIAGNOSIS — R600 Localized edema: Secondary | ICD-10-CM | POA: Diagnosis not present

## 2019-04-13 DIAGNOSIS — I10 Essential (primary) hypertension: Secondary | ICD-10-CM | POA: Diagnosis not present

## 2019-04-14 DIAGNOSIS — I10 Essential (primary) hypertension: Secondary | ICD-10-CM | POA: Diagnosis not present

## 2019-04-14 DIAGNOSIS — L089 Local infection of the skin and subcutaneous tissue, unspecified: Secondary | ICD-10-CM | POA: Diagnosis not present

## 2019-04-14 DIAGNOSIS — R600 Localized edema: Secondary | ICD-10-CM | POA: Diagnosis not present

## 2019-04-22 DIAGNOSIS — R600 Localized edema: Secondary | ICD-10-CM | POA: Diagnosis not present

## 2019-04-22 DIAGNOSIS — I10 Essential (primary) hypertension: Secondary | ICD-10-CM | POA: Diagnosis not present

## 2019-04-22 DIAGNOSIS — L089 Local infection of the skin and subcutaneous tissue, unspecified: Secondary | ICD-10-CM | POA: Diagnosis not present

## 2019-04-23 ENCOUNTER — Other Ambulatory Visit: Payer: Self-pay

## 2019-04-23 ENCOUNTER — Encounter: Payer: Self-pay | Admitting: Podiatry

## 2019-04-23 ENCOUNTER — Ambulatory Visit (INDEPENDENT_AMBULATORY_CARE_PROVIDER_SITE_OTHER): Payer: Medicare Other | Admitting: Podiatry

## 2019-04-23 DIAGNOSIS — M79675 Pain in left toe(s): Secondary | ICD-10-CM

## 2019-04-23 DIAGNOSIS — B351 Tinea unguium: Secondary | ICD-10-CM | POA: Diagnosis not present

## 2019-04-23 DIAGNOSIS — M79674 Pain in right toe(s): Secondary | ICD-10-CM | POA: Diagnosis not present

## 2019-04-23 NOTE — Progress Notes (Signed)
This patient presents  to my office for  foot care.  Patient says her nails are painful walking and wearing her shoes.  This patient is unable to cut nails herself since the patient cannot reach her nails.These nails are painful walking and wearing shoes.  This patient presents for foot care today.  General Appearance  Alert, conversant and in no acute stress.  Vascular  Dorsalis pedis  pulses are weakly  palpable  bilaterally. Posterior tibial pulses are absent  B/L Capillary return is within normal limits  bilaterally. Temperature is within normal limits  bilaterally.  Neurologic  Senn-Weinstein monofilament wire test absent   bilaterally. Muscle power within normal limits bilaterally.  Nails Thick disfigured discolored nails with subungual debris  from hallux to fifth toes bilaterally. No evidence of bacterial infection or drainage bilaterally.  Orthopedic  No limitations of motion  feet .  No crepitus or effusions noted.  No bony pathology or digital deformities noted.  HAV  B/L.    Skin  normotropic skin with no porokeratosis noted bilaterally.  No signs of infections or ulcers noted.     Onychomycosis  Pain in right toes  Pain in left toes  Consent was obtained for treatment procedures.   Mechanical debridement of nails 1-5  bilaterally performed with a nail nipper.  Filed with dremel without incident. No infection or ulcer.     Return office visit    3 months                  Told patient to return for periodic foot care and evaluation due to potential at risk complications.   Roger Fasnacht DPM  

## 2019-04-28 ENCOUNTER — Ambulatory Visit: Payer: Medicare Other | Attending: Internal Medicine

## 2019-04-28 DIAGNOSIS — Z23 Encounter for immunization: Secondary | ICD-10-CM

## 2019-04-28 NOTE — Progress Notes (Signed)
   Covid-19 Vaccination Clinic  Name:  Heidi Daniel    MRN: [id number] DOB: 10/16/1922  04/28/2019  Ms. Heidi Daniel was observed post Covid-19 immunization for 15 minutes without incident. She was provided with Vaccine Information Sheet and instruction to access the V-Safe system.   Ms. Heidi Daniel was instructed to call 911 with any severe reactions post vaccine: Marland Kitchen Difficulty breathing  . Swelling of face and throat  . A fast heartbeat  . A bad rash all over body  . Dizziness and weakness   Immunizations Administered    Name Date Dose VIS Date Route   Pfizer COVID-19 Vaccine 04/28/2019  1:45 PM 0.3 mL 01/15/2019 Intramuscular   Manufacturer: [employer], [employer]   Lot: [id number]   NDC: [address]-2

## 2019-05-14 DIAGNOSIS — G8929 Other chronic pain: Secondary | ICD-10-CM | POA: Diagnosis not present

## 2019-05-27 DIAGNOSIS — Z0000 Encounter for general adult medical examination without abnormal findings: Secondary | ICD-10-CM | POA: Diagnosis not present

## 2019-05-27 DIAGNOSIS — I10 Essential (primary) hypertension: Secondary | ICD-10-CM | POA: Diagnosis not present

## 2019-05-27 DIAGNOSIS — R5383 Other fatigue: Secondary | ICD-10-CM | POA: Diagnosis not present

## 2019-05-27 DIAGNOSIS — N390 Urinary tract infection, site not specified: Secondary | ICD-10-CM | POA: Diagnosis not present

## 2019-05-27 DIAGNOSIS — R600 Localized edema: Secondary | ICD-10-CM | POA: Diagnosis not present

## 2019-07-27 ENCOUNTER — Ambulatory Visit: Payer: BLUE CROSS/BLUE SHIELD | Admitting: Podiatry

## 2019-08-11 ENCOUNTER — Ambulatory Visit (INDEPENDENT_AMBULATORY_CARE_PROVIDER_SITE_OTHER): Payer: Medicare Other | Admitting: Podiatry

## 2019-08-11 ENCOUNTER — Encounter: Payer: Self-pay | Admitting: Podiatry

## 2019-08-11 ENCOUNTER — Other Ambulatory Visit: Payer: Self-pay

## 2019-08-11 DIAGNOSIS — M79675 Pain in left toe(s): Secondary | ICD-10-CM

## 2019-08-11 DIAGNOSIS — B351 Tinea unguium: Secondary | ICD-10-CM

## 2019-08-11 DIAGNOSIS — M79674 Pain in right toe(s): Secondary | ICD-10-CM | POA: Diagnosis not present

## 2019-08-11 NOTE — Progress Notes (Signed)
This patient presents  to my office for  foot care.  Patient says her nails are painful walking and wearing her shoes.  This patient is unable to cut nails herself since the patient cannot reach her nails.These nails are painful walking and wearing shoes.  This patient presents for foot care today.  General Appearance  Alert, conversant and in no acute stress.  Vascular  Dorsalis pedis  pulses are weakly  palpable  bilaterally. Posterior tibial pulses are absent  B/L Capillary return is within normal limits  bilaterally. Temperature is within normal limits  bilaterally.  Neurologic  Senn-Weinstein monofilament wire test absent   bilaterally. Muscle power within normal limits bilaterally.  Nails Thick disfigured discolored nails with subungual debris  from hallux to fifth toes bilaterally. No evidence of bacterial infection or drainage bilaterally.  Orthopedic  No limitations of motion  feet .  No crepitus or effusions noted.  No bony pathology or digital deformities noted.  HAV  B/L.    Skin  normotropic skin with no porokeratosis noted bilaterally.  No signs of infections or ulcers noted.     Onychomycosis  Pain in right toes  Pain in left toes  Consent was obtained for treatment procedures.   Mechanical debridement of nails 1-5  bilaterally performed with a nail nipper.  Filed with dremel without incident. No infection or ulcer.     Return office visit    3 months                  Told patient to return for periodic foot care and evaluation due to potential at risk complications.   Helane Gunther DPM

## 2019-10-12 ENCOUNTER — Ambulatory Visit: Payer: Medicare Other | Admitting: Podiatry

## 2019-11-24 ENCOUNTER — Ambulatory Visit: Payer: BLUE CROSS/BLUE SHIELD | Admitting: Podiatry

## 2019-12-15 DIAGNOSIS — I10 Essential (primary) hypertension: Secondary | ICD-10-CM | POA: Diagnosis not present

## 2019-12-15 DIAGNOSIS — R5383 Other fatigue: Secondary | ICD-10-CM | POA: Diagnosis not present

## 2019-12-15 DIAGNOSIS — R600 Localized edema: Secondary | ICD-10-CM | POA: Diagnosis not present

## 2020-02-17 DIAGNOSIS — R300 Dysuria: Secondary | ICD-10-CM | POA: Diagnosis not present

## 2020-02-21 ENCOUNTER — Other Ambulatory Visit: Payer: Self-pay

## 2020-02-21 ENCOUNTER — Encounter (HOSPITAL_COMMUNITY): Payer: Self-pay

## 2020-02-21 ENCOUNTER — Emergency Department (HOSPITAL_COMMUNITY)
Admission: EM | Admit: 2020-02-21 | Discharge: 2020-02-22 | Disposition: A | Discharge status: Home / Self Care | Payer: Medicare Other | Attending: Emergency Medicine | Admitting: Emergency Medicine

## 2020-02-21 DIAGNOSIS — M4802 Spinal stenosis, cervical region: Secondary | ICD-10-CM | POA: Diagnosis not present

## 2020-02-21 DIAGNOSIS — J3489 Other specified disorders of nose and nasal sinuses: Secondary | ICD-10-CM | POA: Diagnosis not present

## 2020-02-21 DIAGNOSIS — Y9301 Activity, walking, marching and hiking: Secondary | ICD-10-CM | POA: Insufficient documentation

## 2020-02-21 DIAGNOSIS — Z23 Encounter for immunization: Secondary | ICD-10-CM | POA: Diagnosis not present

## 2020-02-21 DIAGNOSIS — S0101XA Laceration without foreign body of scalp, initial encounter: Secondary | ICD-10-CM | POA: Diagnosis not present

## 2020-02-21 DIAGNOSIS — I10 Essential (primary) hypertension: Secondary | ICD-10-CM | POA: Diagnosis not present

## 2020-02-21 DIAGNOSIS — W010XXA Fall on same level from slipping, tripping and stumbling without subsequent striking against object, initial encounter: Secondary | ICD-10-CM | POA: Diagnosis not present

## 2020-02-21 DIAGNOSIS — S199XXA Unspecified injury of neck, initial encounter: Secondary | ICD-10-CM | POA: Diagnosis not present

## 2020-02-21 DIAGNOSIS — S0990XA Unspecified injury of head, initial encounter: Secondary | ICD-10-CM | POA: Diagnosis not present

## 2020-02-21 DIAGNOSIS — I700 Atherosclerosis of aorta: Secondary | ICD-10-CM | POA: Diagnosis not present

## 2020-02-21 DIAGNOSIS — H748X3 Other specified disorders of middle ear and mastoid, bilateral: Secondary | ICD-10-CM | POA: Diagnosis not present

## 2020-02-21 DIAGNOSIS — M47812 Spondylosis without myelopathy or radiculopathy, cervical region: Secondary | ICD-10-CM | POA: Diagnosis not present

## 2020-02-21 DIAGNOSIS — W19XXXA Unspecified fall, initial encounter: Secondary | ICD-10-CM | POA: Diagnosis not present

## 2020-02-21 DIAGNOSIS — Z79899 Other long term (current) drug therapy: Secondary | ICD-10-CM | POA: Diagnosis not present

## 2020-02-21 DIAGNOSIS — E041 Nontoxic single thyroid nodule: Secondary | ICD-10-CM | POA: Diagnosis not present

## 2020-02-21 MED ORDER — TETANUS-DIPHTH-ACELL PERTUSSIS 5-2.5-18.5 LF-MCG/0.5 IM SUSY
0.5000 mL | PREFILLED_SYRINGE | Freq: Once | INTRAMUSCULAR | Status: AC
  Administered 2020-02-21: 0.5 mL via INTRAMUSCULAR
  Filled 2020-02-21: qty 0.5

## 2020-02-21 NOTE — ED Triage Notes (Signed)
Pt BIB EMS, per EMS pt fell while walking to the bathroom with her walker. It was a witnessed fall, her son was there and states that she has fell a few times in the last few weeks. Pt has a small laceration to the back of her head, left side. Pt is A&O x 3.   Vitals 146/80 BP 82 Pulse 18 R 96% Room air

## 2020-02-21 NOTE — ED Provider Notes (Signed)
[facility]-EMERGENCY DEPT Provider Note   CSN: [id number] Arrival date & time: 02/21/20  2239     History Chief Complaint  Patient presents with  . Fall  . Head Laceration    Heidi Daniel is a 87 y.o. female.  HPI     This is a 86 year old female history of hypertension, thyroid disease, hard of hearing who presents following a fall.  Patient reports she tripped and fell walking to the bathroom.  Per EMS, fall was witnessed by her son.  Patient is only reporting injury to her head.  She denies pain elsewhere.  She was ambulatory after the fall per the patient.  She is not on any blood thinners.  She is awake, alert, oriented x3 for EMS.  Vital signs are reassuring.  No recent illnesses.  Past Medical History:  Diagnosis Date  . Cellulitis 07/2016   LOWER EXTREMITIES  . HOH (hard of hearing)   . Hypertension   . Thyroid disease   . UTI (urinary tract infection) 07/2016    Patient Active Problem List   Diagnosis Date Noted  . Pain due to onychomycosis of toenails of both feet 04/23/2019  . UTI (urinary tract infection) 07/29/2016  . HTN (hypertension) 07/29/2016  . Cellulitis 07/29/2016    Past Surgical History:  Procedure Laterality Date  . CHOLECYSTECTOMY       OB History   No obstetric history on file.     History reviewed. No pertinent family history.  Social History   Tobacco Use  . Smoking status: Never Smoker  . Smokeless tobacco: Never Used  Vaping Use  . Vaping Use: Never used  Substance Use Topics  . Alcohol use: No  . Drug use: No    Home Medications Prior to Admission medications   Medication Sig Start Date End Date Taking? Authorizing Provider  amLODipine (NORVASC) 5 MG tablet  06/11/19   [provider]  carvedilol (COREG) 6.25 MG tablet  08/05/19   [provider]  ciprofloxacin (CIPRO) 250 MG tablet  02/22/19   [provider]  lisinopril (PRINIVIL,ZESTRIL) 20 MG tablet Take 20 mg by mouth  daily.    [provider]  Multiple Vitamin (MULTIVITAMIN) capsule Take 1 capsule by mouth daily.    [provider]  nebivolol (BYSTOLIC) 5 MG tablet Take 5 mg by mouth daily.    [provider]  PROCTOZONE-HC 2.5 % rectal cream  02/22/19   [provider]    Allergies    Patient has no known allergies.  Review of Systems   Review of Systems  Constitutional: Negative for fever.  Respiratory: Negative for shortness of breath.   Cardiovascular: Negative for chest pain.  Gastrointestinal: Negative for abdominal pain, nausea and vomiting.  Genitourinary: Negative for dysuria.  Skin: Positive for wound.  Neurological: Negative for weakness.  All other systems reviewed and are negative.   Physical Exam Updated Vital Signs BP (!) 155/88   Pulse 80   Temp 99.5 F (37.5 C) (Oral)   Resp 18   Ht 1.753 m (5\' 9" )   Wt 82.1 kg   SpO2 97%   BMI 26.73 kg/m   Physical Exam Vitals and nursing note reviewed.  Constitutional:      Appearance: She is well-developed and well-nourished.     Comments: Elderly, chronically ill appearing, nontoxic  HENT:     Head: Normocephalic.     Comments: Small laceration with hematoma noted to the left occipital area, no active  bleeding, bruising right temporal region    Nose: Nose normal.     Mouth/Throat:     Mouth: Mucous membranes are moist.  Eyes:     Extraocular Movements: Extraocular movements intact.     Pupils: Pupils are equal, round, and reactive to light.  Neck:     Comments: C-collar in place Cardiovascular:     Rate and Rhythm: Normal rate and regular rhythm.     Heart sounds: Normal heart sounds.  Pulmonary:     Effort: Pulmonary effort is normal. No respiratory distress.     Breath sounds: No wheezing.  Abdominal:     General: Bowel sounds are normal.     Palpations: Abdomen is soft.     Tenderness: There is no guarding or rebound.  Musculoskeletal:        General: No tenderness or  deformity. Normal range of motion.     Comments: Normal range of motion, no obvious deformities  Skin:    General: Skin is warm and dry.  Neurological:     Mental Status: She is alert and oriented to person, place, and time.  Psychiatric:        Mood and Affect: Mood and affect and mood normal.     ED Results / Procedures / Treatments   Labs (all labs ordered are listed, but only abnormal results are displayed) Labs Reviewed - No data to display  EKG None  Radiology CT Head Wo Contrast  Result Date: 02/22/2020 CLINICAL DATA:  Fall walking to the bathroom, multiple falls recently, posterior scalp laceration EXAM: CT HEAD WITHOUT CONTRAST CT CERVICAL SPINE WITHOUT CONTRAST TECHNIQUE: Multidetector CT imaging of the head and cervical spine was performed following the standard protocol without intravenous contrast. Multiplanar CT image reconstructions of the cervical spine were also generated. COMPARISON:  None. FINDINGS: CT HEAD FINDINGS Brain: No evidence of acute infarction, hemorrhage, hydrocephalus, extra-axial collection, visible mass lesion or mass effect. Symmetric prominence of the ventricles, cisterns and sulci compatible with parenchymal volume loss. Patchy areas of white matter hypoattenuation are most compatible with chronic microvascular angiopathy. Vascular: Calcific atherosclerosis at the level of the skull base. No concerning hyperdense vessels. Skull: Left parieto-occipital scalp swelling with thin crescentic scalp hematoma measuring up to 6 mm in maximal thickness and possible punctate focus of subcutaneous gas likely related to laceration. No subjacent calvarial fracture or sutural diastasis. No other acute or worrisome osseous abnormalities. Sinuses/Orbits: Diffuse mural thickening throughout the paranasal sinuses and trace bilateral mastoid effusions. Partial pneumatization of the petrous apices. Middle ear cavities are clear. Debris in the bilateral external auditory canals.  Orbital structures are unremarkable aside from prior lens extractions. Other: None. CT CERVICAL SPINE FINDINGS Alignment: Mild straightening of normal cervical lordosis. 2 mm anterolisthesis C4 on C5 is favored to be on a degenerative basis with spondylitic changes at these levels. No evidence of traumatic listhesis. No abnormally widened, perched or jumped facets. Normal alignment of the craniocervical and atlantoaxial articulations. Skull base and vertebrae: No acute skull base fracture. Mild anterior wedging of C5 appears remote visible acute fracture line. No vertebral body fracture or height loss. Normal bone mineralization. No worrisome osseous lesions. Mild to moderate periarticular spurring at the atlantodental interval. Additional cervical spondylitic changes, as detailed below. Soft tissues and spinal canal: No pre or paravertebral fluid or swelling. No visible canal hematoma. Airways patent. Cervical carotid atherosclerosis seen bilaterally. 1.8 x 1.4 cm subcutaneous nodule in the left posterolateral cervical soft tissues without particularly aggressive features, possibly  a dermal inclusion cyst. Disc levels: Multilevel intervertebral disc height loss with spondylitic endplate changes. Slightly larger disc osteophyte complexes are present C5-6, C6-7 which result in some mild canal stenosis. Additional multilevel uncinate spurring and facet hypertrophic changes result in some mild to moderate multilevel neural foraminal narrowing. No severe canal or foraminal impingement is seen. Upper chest: No acute abnormality in the upper chest or imaged lung apices. Calcification of the aortic arch and proximal great vessels. Other: Heterogeneous, predominantly hypoattenuating 3.3 cm nodule in the left lobe thyroid gland. IMPRESSION: 1. Left parieto-occipital scalp swelling with thin crescentic scalp hematoma measuring up to 6 mm in maximal thickness with punctate focus of subcutaneous gas likely related to  laceration. No subjacent calvarial fracture or sutural diastasis. 2. No acute intracranial abnormality. 3. Chronic microvascular ischemic changes, parenchymal volume loss and intracranial atherosclerosis. 4. No acute cervical spine fracture or traumatic listhesis. 5. Mild anterior wedging of C5 appears remote. Could correlate for point tenderness. 6. Cervical spondylitic and facet hypertrophic changes, as above. 7. 3.3 cm heterogeneous, predominantly hypoattenuating nodule in the left lobe thyroid gland. In the setting of significant comorbidities or limited life expectancy, no follow-up recommended (ref: J Am Coll Radiol. 2015 Feb;12(2): 143-50). 8. Subcutaneous nodule in the left posterolateral cervical soft tissues. Possibly dermal inclusion cyst, correlate with palpation and visual inspection. 9. Aortic Atherosclerosis (ICD10-I70.0). Electronically Signed   By: Kreg Shropshire M.D.   On: 02/22/2020 00:52   CT Cervical Spine Wo Contrast  Result Date: 02/22/2020 CLINICAL DATA:  Fall walking to the bathroom, multiple falls recently, posterior scalp laceration EXAM: CT HEAD WITHOUT CONTRAST CT CERVICAL SPINE WITHOUT CONTRAST TECHNIQUE: Multidetector CT imaging of the head and cervical spine was performed following the standard protocol without intravenous contrast. Multiplanar CT image reconstructions of the cervical spine were also generated. COMPARISON:  None. FINDINGS: CT HEAD FINDINGS Brain: No evidence of acute infarction, hemorrhage, hydrocephalus, extra-axial collection, visible mass lesion or mass effect. Symmetric prominence of the ventricles, cisterns and sulci compatible with parenchymal volume loss. Patchy areas of white matter hypoattenuation are most compatible with chronic microvascular angiopathy. Vascular: Calcific atherosclerosis at the level of the skull base. No concerning hyperdense vessels. Skull: Left parieto-occipital scalp swelling with thin crescentic scalp hematoma measuring up to 6 mm in  maximal thickness and possible punctate focus of subcutaneous gas likely related to laceration. No subjacent calvarial fracture or sutural diastasis. No other acute or worrisome osseous abnormalities. Sinuses/Orbits: Diffuse mural thickening throughout the paranasal sinuses and trace bilateral mastoid effusions. Partial pneumatization of the petrous apices. Middle ear cavities are clear. Debris in the bilateral external auditory canals. Orbital structures are unremarkable aside from prior lens extractions. Other: None. CT CERVICAL SPINE FINDINGS Alignment: Mild straightening of normal cervical lordosis. 2 mm anterolisthesis C4 on C5 is favored to be on a degenerative basis with spondylitic changes at these levels. No evidence of traumatic listhesis. No abnormally widened, perched or jumped facets. Normal alignment of the craniocervical and atlantoaxial articulations. Skull base and vertebrae: No acute skull base fracture. Mild anterior wedging of C5 appears remote visible acute fracture line. No vertebral body fracture or height loss. Normal bone mineralization. No worrisome osseous lesions. Mild to moderate periarticular spurring at the atlantodental interval. Additional cervical spondylitic changes, as detailed below. Soft tissues and spinal canal: No pre or paravertebral fluid or swelling. No visible canal hematoma. Airways patent. Cervical carotid atherosclerosis seen bilaterally. 1.8 x 1.4 cm subcutaneous nodule in the left posterolateral cervical soft tissues without  particularly aggressive features, possibly a dermal inclusion cyst. Disc levels: Multilevel intervertebral disc height loss with spondylitic endplate changes. Slightly larger disc osteophyte complexes are present C5-6, C6-7 which result in some mild canal stenosis. Additional multilevel uncinate spurring and facet hypertrophic changes result in some mild to moderate multilevel neural foraminal narrowing. No severe canal or foraminal impingement is  seen. Upper chest: No acute abnormality in the upper chest or imaged lung apices. Calcification of the aortic arch and proximal great vessels. Other: Heterogeneous, predominantly hypoattenuating 3.3 cm nodule in the left lobe thyroid gland. IMPRESSION: 1. Left parieto-occipital scalp swelling with thin crescentic scalp hematoma measuring up to 6 mm in maximal thickness with punctate focus of subcutaneous gas likely related to laceration. No subjacent calvarial fracture or sutural diastasis. 2. No acute intracranial abnormality. 3. Chronic microvascular ischemic changes, parenchymal volume loss and intracranial atherosclerosis. 4. No acute cervical spine fracture or traumatic listhesis. 5. Mild anterior wedging of C5 appears remote. Could correlate for point tenderness. 6. Cervical spondylitic and facet hypertrophic changes, as above. 7. 3.3 cm heterogeneous, predominantly hypoattenuating nodule in the left lobe thyroid gland. In the setting of significant comorbidities or limited life expectancy, no follow-up recommended (ref: J Am Coll Radiol. 2015 Feb;12(2): 143-50). 8. Subcutaneous nodule in the left posterolateral cervical soft tissues. Possibly dermal inclusion cyst, correlate with palpation and visual inspection. 9. Aortic Atherosclerosis (ICD10-I70.0). Electronically Signed   By: Kreg Shropshire M.D.   On: 02/22/2020 00:52    Procedures .Marland KitchenLaceration Repair  Date/Time: 02/22/2020 1:48 AM Performed by: Shon Baton, MD Authorized by: Shon Baton, MD   Consent:    Consent obtained:  Verbal   Consent given by:  Patient   Risks discussed:  Infection and pain Anesthesia:    Anesthesia method:  None Laceration details:    Location:  Scalp   Scalp location:  L parietal   Length (cm):  3   Depth (mm):  5 Pre-procedure details:    Preparation:  Patient was prepped and draped in usual sterile fashion Exploration:    Imaging outcome: foreign body not noted     Wound extent: no areolar  tissue violation noted, no foreign bodies/material noted, no muscle damage noted, no underlying fracture noted and no vascular damage noted     Contaminated: no   Treatment:    Area cleansed with:  Povidone-iodine   Amount of cleaning:  Standard   Irrigation solution:  Sterile saline   Debridement:  None   Undermining:  None   Scar revision: no   Skin repair:    Repair method:  Staples   Number of staples:  2 Approximation:    Approximation:  Close Repair type:    Repair type:  Simple Post-procedure details:    Dressing:  Open (no dressing)   Procedure completion:  Tolerated   (including critical care time)  Medications Ordered in ED Medications  Tdap (BOOSTRIX) injection 0.5 mL (0.5 mLs Intramuscular Given 02/21/20 2343)    ED Course  I have reviewed the triage vital signs and the nursing notes.  Pertinent labs & imaging results that were available during my care of the patient were reviewed by me and considered in my medical decision making (see chart for details).    MDM Rules/Calculators/A&P                          Patient presents following a fall.  Reports mechanical fall at home.  This  was witnessed by her son.  She is overall nontoxic and vital signs are reassuring.  She has a c-collar in place.  She is not on any anticoagulants.  She has a laceration to the left scalp.  Denies other injury or pain.  Physical exam is otherwise unremarkable.  Tetanus was updated.  Laceration was repaired.  CT head obtained to rule out intracranial injury.  CT is largely reassuring.  She has some C5 anterior wedging which appears remote.  No point tenderness on exam.  She also has an incidental thyroid nodule with no recommended follow-up.  I spoke to the patient's son who confirmed history.  We will plan to transport home by [facility].  After history, exam, and medical workup I feel the patient has been appropriately medically screened and is safe for discharge home. Pertinent diagnoses were  discussed with the patient. Patient was given return precautions.  Final Clinical Impression(s) / ED Diagnoses Final diagnoses:  Laceration of scalp, initial encounter    Rx / DC Orders ED Discharge Orders    None       Breklyn Fabrizio, Mayer Maskerourtney F, MD 02/22/20 0150

## 2020-02-22 ENCOUNTER — Emergency Department (HOSPITAL_COMMUNITY): Payer: Medicare Other

## 2020-02-22 DIAGNOSIS — H748X3 Other specified disorders of middle ear and mastoid, bilateral: Secondary | ICD-10-CM | POA: Diagnosis not present

## 2020-02-22 DIAGNOSIS — R0689 Other abnormalities of breathing: Secondary | ICD-10-CM | POA: Diagnosis not present

## 2020-02-22 DIAGNOSIS — Z7401 Bed confinement status: Secondary | ICD-10-CM | POA: Diagnosis not present

## 2020-02-22 DIAGNOSIS — J3489 Other specified disorders of nose and nasal sinuses: Secondary | ICD-10-CM | POA: Diagnosis not present

## 2020-02-22 DIAGNOSIS — R410 Disorientation, unspecified: Secondary | ICD-10-CM | POA: Diagnosis not present

## 2020-02-22 DIAGNOSIS — S199XXA Unspecified injury of neck, initial encounter: Secondary | ICD-10-CM | POA: Diagnosis not present

## 2020-02-22 DIAGNOSIS — M47812 Spondylosis without myelopathy or radiculopathy, cervical region: Secondary | ICD-10-CM | POA: Diagnosis not present

## 2020-02-22 DIAGNOSIS — I700 Atherosclerosis of aorta: Secondary | ICD-10-CM | POA: Diagnosis not present

## 2020-02-22 DIAGNOSIS — I10 Essential (primary) hypertension: Secondary | ICD-10-CM | POA: Diagnosis not present

## 2020-02-22 DIAGNOSIS — M4802 Spinal stenosis, cervical region: Secondary | ICD-10-CM | POA: Diagnosis not present

## 2020-02-22 DIAGNOSIS — E041 Nontoxic single thyroid nodule: Secondary | ICD-10-CM | POA: Diagnosis not present

## 2020-02-22 DIAGNOSIS — M2550 Pain in unspecified joint: Secondary | ICD-10-CM | POA: Diagnosis not present

## 2020-02-22 DIAGNOSIS — S0101XA Laceration without foreign body of scalp, initial encounter: Secondary | ICD-10-CM | POA: Diagnosis not present

## 2020-02-22 NOTE — Discharge Instructions (Signed)
You have 2 staples.  You need these removed in approximately 10 days.

## 2020-02-22 NOTE — ED Notes (Signed)
Son called at [phone] Heidi Daniel) with [facility] update.

## 2020-03-02 DIAGNOSIS — W19XXXD Unspecified fall, subsequent encounter: Secondary | ICD-10-CM | POA: Diagnosis not present

## 2020-03-02 DIAGNOSIS — S0101XD Laceration without foreign body of scalp, subsequent encounter: Secondary | ICD-10-CM | POA: Diagnosis not present

## 2020-03-02 DIAGNOSIS — Z87440 Personal history of urinary (tract) infections: Secondary | ICD-10-CM | POA: Diagnosis not present

## 2020-03-02 DIAGNOSIS — Z9181 History of falling: Secondary | ICD-10-CM | POA: Diagnosis not present

## 2020-03-02 DIAGNOSIS — I10 Essential (primary) hypertension: Secondary | ICD-10-CM | POA: Diagnosis not present

## 2020-03-08 DIAGNOSIS — S0101XD Laceration without foreign body of scalp, subsequent encounter: Secondary | ICD-10-CM | POA: Diagnosis not present

## 2020-03-08 DIAGNOSIS — Z87440 Personal history of urinary (tract) infections: Secondary | ICD-10-CM | POA: Diagnosis not present

## 2020-03-08 DIAGNOSIS — W19XXXD Unspecified fall, subsequent encounter: Secondary | ICD-10-CM | POA: Diagnosis not present

## 2020-03-08 DIAGNOSIS — I10 Essential (primary) hypertension: Secondary | ICD-10-CM | POA: Diagnosis not present

## 2020-03-08 DIAGNOSIS — Z9181 History of falling: Secondary | ICD-10-CM | POA: Diagnosis not present

## 2020-03-09 DIAGNOSIS — W19XXXD Unspecified fall, subsequent encounter: Secondary | ICD-10-CM | POA: Diagnosis not present

## 2020-03-09 DIAGNOSIS — I10 Essential (primary) hypertension: Secondary | ICD-10-CM | POA: Diagnosis not present

## 2020-03-09 DIAGNOSIS — Z87440 Personal history of urinary (tract) infections: Secondary | ICD-10-CM | POA: Diagnosis not present

## 2020-03-09 DIAGNOSIS — S0101XD Laceration without foreign body of scalp, subsequent encounter: Secondary | ICD-10-CM | POA: Diagnosis not present

## 2020-03-09 DIAGNOSIS — Z9181 History of falling: Secondary | ICD-10-CM | POA: Diagnosis not present

## 2020-03-10 DIAGNOSIS — Z87440 Personal history of urinary (tract) infections: Secondary | ICD-10-CM | POA: Diagnosis not present

## 2020-03-10 DIAGNOSIS — W19XXXD Unspecified fall, subsequent encounter: Secondary | ICD-10-CM | POA: Diagnosis not present

## 2020-03-10 DIAGNOSIS — I10 Essential (primary) hypertension: Secondary | ICD-10-CM | POA: Diagnosis not present

## 2020-03-10 DIAGNOSIS — S0101XD Laceration without foreign body of scalp, subsequent encounter: Secondary | ICD-10-CM | POA: Diagnosis not present

## 2020-03-10 DIAGNOSIS — Z9181 History of falling: Secondary | ICD-10-CM | POA: Diagnosis not present

## 2020-03-14 DIAGNOSIS — W19XXXD Unspecified fall, subsequent encounter: Secondary | ICD-10-CM | POA: Diagnosis not present

## 2020-03-14 DIAGNOSIS — Z87440 Personal history of urinary (tract) infections: Secondary | ICD-10-CM | POA: Diagnosis not present

## 2020-03-14 DIAGNOSIS — I10 Essential (primary) hypertension: Secondary | ICD-10-CM | POA: Diagnosis not present

## 2020-03-14 DIAGNOSIS — Z9181 History of falling: Secondary | ICD-10-CM | POA: Diagnosis not present

## 2020-03-14 DIAGNOSIS — S0101XD Laceration without foreign body of scalp, subsequent encounter: Secondary | ICD-10-CM | POA: Diagnosis not present

## 2020-03-16 DIAGNOSIS — Z87440 Personal history of urinary (tract) infections: Secondary | ICD-10-CM | POA: Diagnosis not present

## 2020-03-16 DIAGNOSIS — S0101XD Laceration without foreign body of scalp, subsequent encounter: Secondary | ICD-10-CM | POA: Diagnosis not present

## 2020-03-16 DIAGNOSIS — W19XXXD Unspecified fall, subsequent encounter: Secondary | ICD-10-CM | POA: Diagnosis not present

## 2020-03-16 DIAGNOSIS — Z9181 History of falling: Secondary | ICD-10-CM | POA: Diagnosis not present

## 2020-03-16 DIAGNOSIS — I10 Essential (primary) hypertension: Secondary | ICD-10-CM | POA: Diagnosis not present

## 2020-03-21 DIAGNOSIS — I10 Essential (primary) hypertension: Secondary | ICD-10-CM | POA: Diagnosis not present

## 2020-03-21 DIAGNOSIS — Z87440 Personal history of urinary (tract) infections: Secondary | ICD-10-CM | POA: Diagnosis not present

## 2020-03-21 DIAGNOSIS — S0101XD Laceration without foreign body of scalp, subsequent encounter: Secondary | ICD-10-CM | POA: Diagnosis not present

## 2020-03-21 DIAGNOSIS — Z9181 History of falling: Secondary | ICD-10-CM | POA: Diagnosis not present

## 2020-03-21 DIAGNOSIS — W19XXXD Unspecified fall, subsequent encounter: Secondary | ICD-10-CM | POA: Diagnosis not present

## 2020-03-23 DIAGNOSIS — Z9181 History of falling: Secondary | ICD-10-CM | POA: Diagnosis not present

## 2020-03-23 DIAGNOSIS — W19XXXD Unspecified fall, subsequent encounter: Secondary | ICD-10-CM | POA: Diagnosis not present

## 2020-03-23 DIAGNOSIS — S0101XD Laceration without foreign body of scalp, subsequent encounter: Secondary | ICD-10-CM | POA: Diagnosis not present

## 2020-03-23 DIAGNOSIS — Z87440 Personal history of urinary (tract) infections: Secondary | ICD-10-CM | POA: Diagnosis not present

## 2020-03-23 DIAGNOSIS — I10 Essential (primary) hypertension: Secondary | ICD-10-CM | POA: Diagnosis not present

## 2020-03-29 DIAGNOSIS — I10 Essential (primary) hypertension: Secondary | ICD-10-CM | POA: Diagnosis not present

## 2020-03-29 DIAGNOSIS — Z9181 History of falling: Secondary | ICD-10-CM | POA: Diagnosis not present

## 2020-03-29 DIAGNOSIS — Z87440 Personal history of urinary (tract) infections: Secondary | ICD-10-CM | POA: Diagnosis not present

## 2020-03-29 DIAGNOSIS — W19XXXD Unspecified fall, subsequent encounter: Secondary | ICD-10-CM | POA: Diagnosis not present

## 2020-03-29 DIAGNOSIS — S0101XD Laceration without foreign body of scalp, subsequent encounter: Secondary | ICD-10-CM | POA: Diagnosis not present

## 2020-03-31 DIAGNOSIS — I10 Essential (primary) hypertension: Secondary | ICD-10-CM | POA: Diagnosis not present

## 2020-03-31 DIAGNOSIS — S0101XD Laceration without foreign body of scalp, subsequent encounter: Secondary | ICD-10-CM | POA: Diagnosis not present

## 2020-03-31 DIAGNOSIS — W19XXXD Unspecified fall, subsequent encounter: Secondary | ICD-10-CM | POA: Diagnosis not present

## 2020-03-31 DIAGNOSIS — Z9181 History of falling: Secondary | ICD-10-CM | POA: Diagnosis not present

## 2020-03-31 DIAGNOSIS — Z87440 Personal history of urinary (tract) infections: Secondary | ICD-10-CM | POA: Diagnosis not present

## 2020-04-01 DIAGNOSIS — Z87440 Personal history of urinary (tract) infections: Secondary | ICD-10-CM | POA: Diagnosis not present

## 2020-04-01 DIAGNOSIS — W19XXXD Unspecified fall, subsequent encounter: Secondary | ICD-10-CM | POA: Diagnosis not present

## 2020-04-01 DIAGNOSIS — I10 Essential (primary) hypertension: Secondary | ICD-10-CM | POA: Diagnosis not present

## 2020-04-01 DIAGNOSIS — Z9181 History of falling: Secondary | ICD-10-CM | POA: Diagnosis not present

## 2020-04-01 DIAGNOSIS — S0101XD Laceration without foreign body of scalp, subsequent encounter: Secondary | ICD-10-CM | POA: Diagnosis not present

## 2020-04-03 DIAGNOSIS — Z87440 Personal history of urinary (tract) infections: Secondary | ICD-10-CM | POA: Diagnosis not present

## 2020-04-03 DIAGNOSIS — W19XXXD Unspecified fall, subsequent encounter: Secondary | ICD-10-CM | POA: Diagnosis not present

## 2020-04-03 DIAGNOSIS — I10 Essential (primary) hypertension: Secondary | ICD-10-CM | POA: Diagnosis not present

## 2020-04-03 DIAGNOSIS — S0101XD Laceration without foreign body of scalp, subsequent encounter: Secondary | ICD-10-CM | POA: Diagnosis not present

## 2020-04-03 DIAGNOSIS — Z9181 History of falling: Secondary | ICD-10-CM | POA: Diagnosis not present

## 2020-04-05 ENCOUNTER — Encounter: Payer: Self-pay | Admitting: *Deleted

## 2020-04-05 NOTE — Progress Notes (Signed)
   Pt was admitted to [facility]--the [facility] of [facility]--in the home on 04/05/20.  Pt will be receiving nurse visits 1-3 x/month and home-based social worker support as well.  Ellard Artis Referral Specialist [phone]

## 2020-04-06 DIAGNOSIS — I10 Essential (primary) hypertension: Secondary | ICD-10-CM | POA: Diagnosis not present

## 2020-04-06 DIAGNOSIS — Z9181 History of falling: Secondary | ICD-10-CM | POA: Diagnosis not present

## 2020-04-06 DIAGNOSIS — W19XXXD Unspecified fall, subsequent encounter: Secondary | ICD-10-CM | POA: Diagnosis not present

## 2020-04-06 DIAGNOSIS — Z87440 Personal history of urinary (tract) infections: Secondary | ICD-10-CM | POA: Diagnosis not present

## 2020-04-06 DIAGNOSIS — S0101XD Laceration without foreign body of scalp, subsequent encounter: Secondary | ICD-10-CM | POA: Diagnosis not present

## 2020-04-11 DIAGNOSIS — Z87440 Personal history of urinary (tract) infections: Secondary | ICD-10-CM | POA: Diagnosis not present

## 2020-04-11 DIAGNOSIS — Z9181 History of falling: Secondary | ICD-10-CM | POA: Diagnosis not present

## 2020-04-11 DIAGNOSIS — W19XXXD Unspecified fall, subsequent encounter: Secondary | ICD-10-CM | POA: Diagnosis not present

## 2020-04-11 DIAGNOSIS — I10 Essential (primary) hypertension: Secondary | ICD-10-CM | POA: Diagnosis not present

## 2020-04-11 DIAGNOSIS — S0101XD Laceration without foreign body of scalp, subsequent encounter: Secondary | ICD-10-CM | POA: Diagnosis not present

## 2020-04-20 DIAGNOSIS — R296 Repeated falls: Secondary | ICD-10-CM | POA: Diagnosis not present

## 2020-04-20 DIAGNOSIS — Z789 Other specified health status: Secondary | ICD-10-CM | POA: Diagnosis not present

## 2020-04-20 DIAGNOSIS — Z87440 Personal history of urinary (tract) infections: Secondary | ICD-10-CM | POA: Diagnosis not present

## 2020-04-20 DIAGNOSIS — S0101XD Laceration without foreign body of scalp, subsequent encounter: Secondary | ICD-10-CM | POA: Diagnosis not present

## 2020-04-20 DIAGNOSIS — W19XXXD Unspecified fall, subsequent encounter: Secondary | ICD-10-CM | POA: Diagnosis not present

## 2020-04-20 DIAGNOSIS — Z9181 History of falling: Secondary | ICD-10-CM | POA: Diagnosis not present

## 2020-04-20 DIAGNOSIS — R531 Weakness: Secondary | ICD-10-CM | POA: Diagnosis not present

## 2020-04-20 DIAGNOSIS — Z515 Encounter for palliative care: Secondary | ICD-10-CM | POA: Diagnosis not present

## 2020-04-20 DIAGNOSIS — Z7409 Other reduced mobility: Secondary | ICD-10-CM | POA: Diagnosis not present

## 2020-04-20 DIAGNOSIS — I10 Essential (primary) hypertension: Secondary | ICD-10-CM | POA: Diagnosis not present

## 2020-04-20 DIAGNOSIS — R413 Other amnesia: Secondary | ICD-10-CM | POA: Diagnosis not present

## 2020-07-04 DIAGNOSIS — I10 Essential (primary) hypertension: Secondary | ICD-10-CM | POA: Diagnosis not present

## 2020-07-10 DIAGNOSIS — Z7409 Other reduced mobility: Secondary | ICD-10-CM | POA: Diagnosis not present

## 2020-07-10 DIAGNOSIS — R413 Other amnesia: Secondary | ICD-10-CM | POA: Diagnosis not present

## 2020-07-10 DIAGNOSIS — R296 Repeated falls: Secondary | ICD-10-CM | POA: Diagnosis not present

## 2020-07-10 DIAGNOSIS — Z789 Other specified health status: Secondary | ICD-10-CM | POA: Diagnosis not present

## 2020-07-10 DIAGNOSIS — Z515 Encounter for palliative care: Secondary | ICD-10-CM | POA: Diagnosis not present

## 2020-07-10 DIAGNOSIS — R531 Weakness: Secondary | ICD-10-CM | POA: Diagnosis not present

## 2020-07-10 DIAGNOSIS — I10 Essential (primary) hypertension: Secondary | ICD-10-CM | POA: Diagnosis not present

## 2020-08-03 DIAGNOSIS — I10 Essential (primary) hypertension: Secondary | ICD-10-CM | POA: Diagnosis not present

## 2020-09-03 DIAGNOSIS — I10 Essential (primary) hypertension: Secondary | ICD-10-CM | POA: Diagnosis not present

## 2020-10-04 DIAGNOSIS — I10 Essential (primary) hypertension: Secondary | ICD-10-CM | POA: Diagnosis not present

## 2020-11-17 DIAGNOSIS — R296 Repeated falls: Secondary | ICD-10-CM | POA: Diagnosis not present

## 2020-11-17 DIAGNOSIS — R531 Weakness: Secondary | ICD-10-CM | POA: Diagnosis not present

## 2020-11-17 DIAGNOSIS — I10 Essential (primary) hypertension: Secondary | ICD-10-CM | POA: Diagnosis not present

## 2020-11-17 DIAGNOSIS — Z7409 Other reduced mobility: Secondary | ICD-10-CM | POA: Diagnosis not present

## 2020-11-17 DIAGNOSIS — Z515 Encounter for palliative care: Secondary | ICD-10-CM | POA: Diagnosis not present

## 2020-11-17 DIAGNOSIS — R600 Localized edema: Secondary | ICD-10-CM | POA: Diagnosis not present

## 2020-11-17 DIAGNOSIS — R5383 Other fatigue: Secondary | ICD-10-CM | POA: Diagnosis not present

## 2020-11-17 DIAGNOSIS — R413 Other amnesia: Secondary | ICD-10-CM | POA: Diagnosis not present

## 2020-11-17 DIAGNOSIS — Z789 Other specified health status: Secondary | ICD-10-CM | POA: Diagnosis not present

## 2021-02-28 ENCOUNTER — Encounter (HOSPITAL_COMMUNITY): Payer: Self-pay | Admitting: Emergency Medicine

## 2021-02-28 ENCOUNTER — Emergency Department (HOSPITAL_COMMUNITY)
Admission: EM | Admit: 2021-02-28 | Discharge: 2021-02-28 | Disposition: A | Discharge status: Home / Self Care | Payer: Medicare Other | Attending: Emergency Medicine | Admitting: Emergency Medicine

## 2021-02-28 ENCOUNTER — Emergency Department (HOSPITAL_COMMUNITY): Payer: Medicare Other

## 2021-02-28 DIAGNOSIS — M4692 Unspecified inflammatory spondylopathy, cervical region: Secondary | ICD-10-CM | POA: Insufficient documentation

## 2021-02-28 DIAGNOSIS — R52 Pain, unspecified: Secondary | ICD-10-CM | POA: Diagnosis not present

## 2021-02-28 DIAGNOSIS — M542 Cervicalgia: Secondary | ICD-10-CM

## 2021-02-28 DIAGNOSIS — Z743 Need for continuous supervision: Secondary | ICD-10-CM | POA: Diagnosis not present

## 2021-02-28 DIAGNOSIS — M47812 Spondylosis without myelopathy or radiculopathy, cervical region: Secondary | ICD-10-CM

## 2021-02-28 NOTE — ED Provider Notes (Signed)
[facility] DEPT Provider Note   CSN: [id number] Arrival date & time: 02/28/21  1322     History  Chief Complaint  Patient presents with   Neck Pain    Heidi Daniel is a 88 y.o. female.  She is brought in by ambulance for evaluation of right-sided neck pain.  She said she woke up with it.  It is unclear how long it lasted but it seems to be resolved now.  She had no difficulty swallowing.  No numbness or weakness.  The history is provided by the patient.  Neck Pain Pain location:  R side Quality:  Unable to specify Pain radiates to:  Does not radiate Pain severity:  Unable to specify Progression:  Resolved Chronicity:  New Context: not fall   Relieved by:  None tried Worsened by:  Twisting Ineffective treatments:  None tried Associated symptoms: no chest pain, no fever, no numbness, no syncope, no visual change and no weakness       Home Medications Prior to Admission medications   Medication Sig Start Date End Date Taking? Authorizing Provider  amLODipine (NORVASC) 5 MG tablet  06/11/19   [provider]  carvedilol (COREG) 6.25 MG tablet  08/05/19   [provider]  ciprofloxacin (CIPRO) 250 MG tablet  02/22/19   [provider]  lisinopril (PRINIVIL,ZESTRIL) 20 MG tablet Take 20 mg by mouth daily.    [provider]  Multiple Vitamin (MULTIVITAMIN) capsule Take 1 capsule by mouth daily.    [provider]  nebivolol (BYSTOLIC) 5 MG tablet Take 5 mg by mouth daily.    [provider]  PROCTOZONE-HC 2.5 % rectal cream  02/22/19   [provider]      Allergies    Patient has no known allergies.    Review of Systems   Review of Systems  Constitutional:  Negative for fever.  HENT:  Negative for sore throat.   Eyes:  Negative for visual disturbance.  Respiratory:  Negative for shortness of breath.   Cardiovascular:  Negative for chest pain and syncope.  Musculoskeletal:  Positive  for neck pain.  Skin:  Negative for rash.  Neurological:  Negative for weakness and numbness.   Physical Exam Updated Vital Signs BP (!) 163/83    Pulse 87    Temp 97.9 F (36.6 C) (Oral)    Resp 16    Ht 5\' 9"  (1.753 m)    Wt 66.2 kg    SpO2 98%    BMI 21.56 kg/m  Physical Exam Vitals and nursing note reviewed.  Constitutional:      General: She is not in acute distress.    Appearance: Normal appearance. She is well-developed.  HENT:     Head: Normocephalic and atraumatic.  Eyes:     Conjunctiva/sclera: Conjunctivae normal.  Cardiovascular:     Rate and Rhythm: Normal rate and regular rhythm.     Heart sounds: No murmur heard. Pulmonary:     Effort: Pulmonary effort is normal. No respiratory distress.     Breath sounds: Normal breath sounds.  Abdominal:     Palpations: Abdomen is soft.     Tenderness: There is no abdominal tenderness.  Musculoskeletal:        General: No swelling.     Cervical back: Neck supple. Tenderness (She has some tenderness with palpation of her right-sided neck muscles) present.  Skin:    General: Skin is warm and dry.     Capillary Refill:  Capillary refill takes less than 2 seconds.  Neurological:     General: No focal deficit present.     Mental Status: She is alert.     Sensory: No sensory deficit.     Motor: No weakness.    ED Results / Procedures / Treatments   Labs (all labs ordered are listed, but only abnormal results are displayed) Labs Reviewed - No data to display  EKG None  Radiology CT Cervical Spine Wo Contrast  Result Date: 02/28/2021 CLINICAL DATA:  Acute neck pain without known injury. EXAM: CT CERVICAL SPINE WITHOUT CONTRAST TECHNIQUE: Multidetector CT imaging of the cervical spine was performed without intravenous contrast. Multiplanar CT image reconstructions were also generated. RADIATION DOSE REDUCTION: This exam was performed according to the departmental dose-optimization program which includes automated exposure  control, adjustment of the mA and/or kV according to patient size and/or use of iterative reconstruction technique. COMPARISON:  February 22, 2020. FINDINGS: Alignment: Mild grade 1 anterolisthesis of C4-5 is noted secondary to posterior facet joint hypertrophy. Skull base and vertebrae: No acute fracture. No primary bone lesion or focal pathologic process. Soft tissues and spinal canal: No prevertebral fluid or swelling. No visible canal hematoma. Disc levels: Severe degenerative disc disease is noted at C5-6 and C6-7. Upper chest: Negative. Other: Severe degenerative changes are seen involving the right-sided posterior facet joints. IMPRESSION: Severe multilevel degenerative disc disease. No acute abnormality seen in the cervical spine. Electronically Signed   By: Marijo Conception M.D.   On: 02/28/2021 15:26    Procedures Procedures    Medications Ordered in ED Medications - No data to display  ED Course/ Medical Decision Making/ A&P Clinical Course as of 03/01/21 0958  Wed Feb 28, 2021  1727 Son is now in the room and confirms the history the patient had some right-sided neck pain that started around 11 AM this morning.  She did not exhibit any difficulty breathing or swallowing and did not have any neurologic symptoms.  He agrees that she is back to baseline now.  Recommended symptomatic treatment with Tylenol ibuprofen and warm compress.  Return instructions discussed [MB]    Clinical Course User Index [MB] Hayden Rasmussen, MD                           Medical Decision Making  Atraumatic neck pain differential including musculoskeletal pain, compression fracture, radiculopathy infection, dissection.  Physical exam with reproducible musculoskeletal tenderness.  No neurologic signs or symptoms.  CT imaging shows significant arthritic changes.  Reviewed with patient and son.  They are comfortable plan for symptomatic treatment and outpatient follow-up with her treating provider.  Return  instructions discussed.  No indications for admission at this time        Final Clinical Impression(s) / ED Diagnoses Final diagnoses:  Neck pain on right side  Cervical arthritis    Rx / DC Orders ED Discharge Orders     None         Hayden Rasmussen, MD 03/01/21 1000

## 2021-02-28 NOTE — ED Provider Triage Note (Signed)
Emergency Medicine Provider Triage Evaluation Note  Heidi Daniel , a 88 y.o. female  was evaluated in triage.  Pt states she is here for a checkup. She said her son wanted her to get checked. She says her throat hurts. Denies fevers, cough, chest pain, sob. Pt is a poor historian.  1:58 PM discussed case with the patients son. He states that the pt was c/o neck pain this afternoon. She does not typically complain so he wanted her to get checked out.   Review of Systems  Positive: Neck pain Negative: fevers  Physical Exam  BP 134/75    Pulse 85    Temp 98.2 F (36.8 C) (Oral)    Resp 16    Ht 5\' 9"  (1.753 m)    Wt 66.2 kg    SpO2 96%    BMI 21.56 kg/m  Gen:   Awake, no distress   Resp:  Normal effort  MSK:   Moves extremities without difficulty  Other:  No midline TTP to the cervical spine, TTP to the bilat cervical paraspinous muscles  Medical Decision Making  Medically screening exam initiated at 9:19 PM.  Appropriate orders placed.  Heidi Daniel was informed that the remainder of the evaluation will be completed by another provider, this initial triage assessment does not replace that evaluation, and the importance of remaining in the ED until their evaluation is complete.     Andi Hence, Karrie Meres 02/28/21 2119

## 2021-02-28 NOTE — ED Triage Notes (Signed)
Per EMS-coming from home-complaining of right sided neck pain-woke up this way

## 2021-02-28 NOTE — Discharge Instructions (Signed)
You were seen in the emergency department for right-sided neck pain.  You had a CAT scan of your cervical spine that showed arthritis changes.  This is probably muscular pain.  You can treat it symptomatically with Tylenol ibuprofen.  Warm compress.  Follow-up with your primary care doctor.  Return to the emergency department if any worsening or concerning symptoms.

## 2021-03-02 DIAGNOSIS — R531 Weakness: Secondary | ICD-10-CM | POA: Diagnosis not present

## 2021-03-02 DIAGNOSIS — R296 Repeated falls: Secondary | ICD-10-CM | POA: Diagnosis not present

## 2021-03-02 DIAGNOSIS — R5383 Other fatigue: Secondary | ICD-10-CM | POA: Diagnosis not present

## 2021-03-02 DIAGNOSIS — R413 Other amnesia: Secondary | ICD-10-CM | POA: Diagnosis not present

## 2021-03-02 DIAGNOSIS — Z7409 Other reduced mobility: Secondary | ICD-10-CM | POA: Diagnosis not present

## 2021-03-06 DIAGNOSIS — R419 Unspecified symptoms and signs involving cognitive functions and awareness: Secondary | ICD-10-CM | POA: Diagnosis not present

## 2021-03-06 DIAGNOSIS — Z9181 History of falling: Secondary | ICD-10-CM | POA: Diagnosis not present

## 2021-03-06 DIAGNOSIS — I10 Essential (primary) hypertension: Secondary | ICD-10-CM | POA: Diagnosis not present

## 2021-03-06 DIAGNOSIS — R296 Repeated falls: Secondary | ICD-10-CM | POA: Diagnosis not present

## 2021-03-06 DIAGNOSIS — M47812 Spondylosis without myelopathy or radiculopathy, cervical region: Secondary | ICD-10-CM | POA: Diagnosis not present

## 2021-03-06 DIAGNOSIS — R413 Other amnesia: Secondary | ICD-10-CM | POA: Diagnosis not present

## 2021-03-15 DIAGNOSIS — M47812 Spondylosis without myelopathy or radiculopathy, cervical region: Secondary | ICD-10-CM | POA: Diagnosis not present

## 2021-03-15 DIAGNOSIS — Z9181 History of falling: Secondary | ICD-10-CM | POA: Diagnosis not present

## 2021-03-15 DIAGNOSIS — R419 Unspecified symptoms and signs involving cognitive functions and awareness: Secondary | ICD-10-CM | POA: Diagnosis not present

## 2021-03-15 DIAGNOSIS — R413 Other amnesia: Secondary | ICD-10-CM | POA: Diagnosis not present

## 2021-03-15 DIAGNOSIS — I10 Essential (primary) hypertension: Secondary | ICD-10-CM | POA: Diagnosis not present

## 2021-03-15 DIAGNOSIS — R296 Repeated falls: Secondary | ICD-10-CM | POA: Diagnosis not present

## 2021-03-16 DIAGNOSIS — I10 Essential (primary) hypertension: Secondary | ICD-10-CM | POA: Diagnosis not present

## 2021-03-16 DIAGNOSIS — R413 Other amnesia: Secondary | ICD-10-CM | POA: Diagnosis not present

## 2021-03-16 DIAGNOSIS — M47812 Spondylosis without myelopathy or radiculopathy, cervical region: Secondary | ICD-10-CM | POA: Diagnosis not present

## 2021-03-16 DIAGNOSIS — R296 Repeated falls: Secondary | ICD-10-CM | POA: Diagnosis not present

## 2021-03-16 DIAGNOSIS — Z9181 History of falling: Secondary | ICD-10-CM | POA: Diagnosis not present

## 2021-03-16 DIAGNOSIS — R419 Unspecified symptoms and signs involving cognitive functions and awareness: Secondary | ICD-10-CM | POA: Diagnosis not present

## 2021-03-17 DIAGNOSIS — R419 Unspecified symptoms and signs involving cognitive functions and awareness: Secondary | ICD-10-CM | POA: Diagnosis not present

## 2021-03-17 DIAGNOSIS — R413 Other amnesia: Secondary | ICD-10-CM | POA: Diagnosis not present

## 2021-03-17 DIAGNOSIS — R296 Repeated falls: Secondary | ICD-10-CM | POA: Diagnosis not present

## 2021-03-17 DIAGNOSIS — I10 Essential (primary) hypertension: Secondary | ICD-10-CM | POA: Diagnosis not present

## 2021-03-17 DIAGNOSIS — M47812 Spondylosis without myelopathy or radiculopathy, cervical region: Secondary | ICD-10-CM | POA: Diagnosis not present

## 2021-03-17 DIAGNOSIS — Z9181 History of falling: Secondary | ICD-10-CM | POA: Diagnosis not present

## 2021-03-19 DIAGNOSIS — R419 Unspecified symptoms and signs involving cognitive functions and awareness: Secondary | ICD-10-CM | POA: Diagnosis not present

## 2021-03-19 DIAGNOSIS — I10 Essential (primary) hypertension: Secondary | ICD-10-CM | POA: Diagnosis not present

## 2021-03-19 DIAGNOSIS — Z9181 History of falling: Secondary | ICD-10-CM | POA: Diagnosis not present

## 2021-03-19 DIAGNOSIS — R296 Repeated falls: Secondary | ICD-10-CM | POA: Diagnosis not present

## 2021-03-19 DIAGNOSIS — M47812 Spondylosis without myelopathy or radiculopathy, cervical region: Secondary | ICD-10-CM | POA: Diagnosis not present

## 2021-03-19 DIAGNOSIS — R413 Other amnesia: Secondary | ICD-10-CM | POA: Diagnosis not present

## 2021-03-20 DIAGNOSIS — M47812 Spondylosis without myelopathy or radiculopathy, cervical region: Secondary | ICD-10-CM | POA: Diagnosis not present

## 2021-03-20 DIAGNOSIS — Z9181 History of falling: Secondary | ICD-10-CM | POA: Diagnosis not present

## 2021-03-20 DIAGNOSIS — R419 Unspecified symptoms and signs involving cognitive functions and awareness: Secondary | ICD-10-CM | POA: Diagnosis not present

## 2021-03-20 DIAGNOSIS — R296 Repeated falls: Secondary | ICD-10-CM | POA: Diagnosis not present

## 2021-03-20 DIAGNOSIS — R413 Other amnesia: Secondary | ICD-10-CM | POA: Diagnosis not present

## 2021-03-20 DIAGNOSIS — I10 Essential (primary) hypertension: Secondary | ICD-10-CM | POA: Diagnosis not present

## 2021-03-22 DIAGNOSIS — M47812 Spondylosis without myelopathy or radiculopathy, cervical region: Secondary | ICD-10-CM | POA: Diagnosis not present

## 2021-03-22 DIAGNOSIS — R413 Other amnesia: Secondary | ICD-10-CM | POA: Diagnosis not present

## 2021-03-22 DIAGNOSIS — Z9181 History of falling: Secondary | ICD-10-CM | POA: Diagnosis not present

## 2021-03-22 DIAGNOSIS — R296 Repeated falls: Secondary | ICD-10-CM | POA: Diagnosis not present

## 2021-03-22 DIAGNOSIS — R419 Unspecified symptoms and signs involving cognitive functions and awareness: Secondary | ICD-10-CM | POA: Diagnosis not present

## 2021-03-22 DIAGNOSIS — I10 Essential (primary) hypertension: Secondary | ICD-10-CM | POA: Diagnosis not present

## 2021-03-26 DIAGNOSIS — M47812 Spondylosis without myelopathy or radiculopathy, cervical region: Secondary | ICD-10-CM | POA: Diagnosis not present

## 2021-03-26 DIAGNOSIS — R296 Repeated falls: Secondary | ICD-10-CM | POA: Diagnosis not present

## 2021-03-26 DIAGNOSIS — R419 Unspecified symptoms and signs involving cognitive functions and awareness: Secondary | ICD-10-CM | POA: Diagnosis not present

## 2021-03-26 DIAGNOSIS — Z9181 History of falling: Secondary | ICD-10-CM | POA: Diagnosis not present

## 2021-03-26 DIAGNOSIS — I10 Essential (primary) hypertension: Secondary | ICD-10-CM | POA: Diagnosis not present

## 2021-03-26 DIAGNOSIS — R413 Other amnesia: Secondary | ICD-10-CM | POA: Diagnosis not present

## 2021-03-29 DIAGNOSIS — M47812 Spondylosis without myelopathy or radiculopathy, cervical region: Secondary | ICD-10-CM | POA: Diagnosis not present

## 2021-03-29 DIAGNOSIS — R419 Unspecified symptoms and signs involving cognitive functions and awareness: Secondary | ICD-10-CM | POA: Diagnosis not present

## 2021-03-29 DIAGNOSIS — R413 Other amnesia: Secondary | ICD-10-CM | POA: Diagnosis not present

## 2021-03-29 DIAGNOSIS — R296 Repeated falls: Secondary | ICD-10-CM | POA: Diagnosis not present

## 2021-03-29 DIAGNOSIS — Z9181 History of falling: Secondary | ICD-10-CM | POA: Diagnosis not present

## 2021-03-29 DIAGNOSIS — I10 Essential (primary) hypertension: Secondary | ICD-10-CM | POA: Diagnosis not present

## 2021-03-31 DIAGNOSIS — R419 Unspecified symptoms and signs involving cognitive functions and awareness: Secondary | ICD-10-CM | POA: Diagnosis not present

## 2021-03-31 DIAGNOSIS — M47812 Spondylosis without myelopathy or radiculopathy, cervical region: Secondary | ICD-10-CM | POA: Diagnosis not present

## 2021-03-31 DIAGNOSIS — R296 Repeated falls: Secondary | ICD-10-CM | POA: Diagnosis not present

## 2021-03-31 DIAGNOSIS — I10 Essential (primary) hypertension: Secondary | ICD-10-CM | POA: Diagnosis not present

## 2021-03-31 DIAGNOSIS — R413 Other amnesia: Secondary | ICD-10-CM | POA: Diagnosis not present

## 2021-03-31 DIAGNOSIS — Z9181 History of falling: Secondary | ICD-10-CM | POA: Diagnosis not present

## 2021-04-02 DIAGNOSIS — Z9181 History of falling: Secondary | ICD-10-CM | POA: Diagnosis not present

## 2021-04-02 DIAGNOSIS — I10 Essential (primary) hypertension: Secondary | ICD-10-CM | POA: Diagnosis not present

## 2021-04-02 DIAGNOSIS — R419 Unspecified symptoms and signs involving cognitive functions and awareness: Secondary | ICD-10-CM | POA: Diagnosis not present

## 2021-04-02 DIAGNOSIS — R413 Other amnesia: Secondary | ICD-10-CM | POA: Diagnosis not present

## 2021-04-02 DIAGNOSIS — R296 Repeated falls: Secondary | ICD-10-CM | POA: Diagnosis not present

## 2021-04-02 DIAGNOSIS — M47812 Spondylosis without myelopathy or radiculopathy, cervical region: Secondary | ICD-10-CM | POA: Diagnosis not present

## 2021-04-05 DIAGNOSIS — Z9181 History of falling: Secondary | ICD-10-CM | POA: Diagnosis not present

## 2021-04-05 DIAGNOSIS — R419 Unspecified symptoms and signs involving cognitive functions and awareness: Secondary | ICD-10-CM | POA: Diagnosis not present

## 2021-04-05 DIAGNOSIS — R413 Other amnesia: Secondary | ICD-10-CM | POA: Diagnosis not present

## 2021-04-05 DIAGNOSIS — M47812 Spondylosis without myelopathy or radiculopathy, cervical region: Secondary | ICD-10-CM | POA: Diagnosis not present

## 2021-04-05 DIAGNOSIS — R296 Repeated falls: Secondary | ICD-10-CM | POA: Diagnosis not present

## 2021-04-05 DIAGNOSIS — I10 Essential (primary) hypertension: Secondary | ICD-10-CM | POA: Diagnosis not present

## 2021-04-09 DIAGNOSIS — I10 Essential (primary) hypertension: Secondary | ICD-10-CM | POA: Diagnosis not present

## 2021-04-09 DIAGNOSIS — Z9181 History of falling: Secondary | ICD-10-CM | POA: Diagnosis not present

## 2021-04-09 DIAGNOSIS — R296 Repeated falls: Secondary | ICD-10-CM | POA: Diagnosis not present

## 2021-04-09 DIAGNOSIS — R419 Unspecified symptoms and signs involving cognitive functions and awareness: Secondary | ICD-10-CM | POA: Diagnosis not present

## 2021-04-09 DIAGNOSIS — R413 Other amnesia: Secondary | ICD-10-CM | POA: Diagnosis not present

## 2021-04-09 DIAGNOSIS — M47812 Spondylosis without myelopathy or radiculopathy, cervical region: Secondary | ICD-10-CM | POA: Diagnosis not present

## 2021-04-10 DIAGNOSIS — R419 Unspecified symptoms and signs involving cognitive functions and awareness: Secondary | ICD-10-CM | POA: Diagnosis not present

## 2021-04-10 DIAGNOSIS — R296 Repeated falls: Secondary | ICD-10-CM | POA: Diagnosis not present

## 2021-04-10 DIAGNOSIS — I10 Essential (primary) hypertension: Secondary | ICD-10-CM | POA: Diagnosis not present

## 2021-04-10 DIAGNOSIS — Z9181 History of falling: Secondary | ICD-10-CM | POA: Diagnosis not present

## 2021-04-10 DIAGNOSIS — R413 Other amnesia: Secondary | ICD-10-CM | POA: Diagnosis not present

## 2021-04-10 DIAGNOSIS — M47812 Spondylosis without myelopathy or radiculopathy, cervical region: Secondary | ICD-10-CM | POA: Diagnosis not present

## 2021-04-12 DIAGNOSIS — I10 Essential (primary) hypertension: Secondary | ICD-10-CM | POA: Diagnosis not present

## 2021-04-12 DIAGNOSIS — R419 Unspecified symptoms and signs involving cognitive functions and awareness: Secondary | ICD-10-CM | POA: Diagnosis not present

## 2021-04-12 DIAGNOSIS — Z9181 History of falling: Secondary | ICD-10-CM | POA: Diagnosis not present

## 2021-04-12 DIAGNOSIS — M47812 Spondylosis without myelopathy or radiculopathy, cervical region: Secondary | ICD-10-CM | POA: Diagnosis not present

## 2021-04-12 DIAGNOSIS — R413 Other amnesia: Secondary | ICD-10-CM | POA: Diagnosis not present

## 2021-04-12 DIAGNOSIS — R296 Repeated falls: Secondary | ICD-10-CM | POA: Diagnosis not present

## 2021-04-13 DIAGNOSIS — R419 Unspecified symptoms and signs involving cognitive functions and awareness: Secondary | ICD-10-CM | POA: Diagnosis not present

## 2021-04-13 DIAGNOSIS — Z9181 History of falling: Secondary | ICD-10-CM | POA: Diagnosis not present

## 2021-04-13 DIAGNOSIS — R413 Other amnesia: Secondary | ICD-10-CM | POA: Diagnosis not present

## 2021-04-13 DIAGNOSIS — R296 Repeated falls: Secondary | ICD-10-CM | POA: Diagnosis not present

## 2021-04-13 DIAGNOSIS — M47812 Spondylosis without myelopathy or radiculopathy, cervical region: Secondary | ICD-10-CM | POA: Diagnosis not present

## 2021-04-13 DIAGNOSIS — I10 Essential (primary) hypertension: Secondary | ICD-10-CM | POA: Diagnosis not present

## 2021-04-17 DIAGNOSIS — Z0189 Encounter for other specified special examinations: Secondary | ICD-10-CM | POA: Diagnosis not present

## 2021-04-17 DIAGNOSIS — R296 Repeated falls: Secondary | ICD-10-CM | POA: Diagnosis not present

## 2021-04-17 DIAGNOSIS — M47812 Spondylosis without myelopathy or radiculopathy, cervical region: Secondary | ICD-10-CM | POA: Diagnosis not present

## 2021-04-17 DIAGNOSIS — R419 Unspecified symptoms and signs involving cognitive functions and awareness: Secondary | ICD-10-CM | POA: Diagnosis not present

## 2021-04-17 DIAGNOSIS — I10 Essential (primary) hypertension: Secondary | ICD-10-CM | POA: Diagnosis not present

## 2021-04-17 DIAGNOSIS — R413 Other amnesia: Secondary | ICD-10-CM | POA: Diagnosis not present

## 2021-04-17 DIAGNOSIS — Z9181 History of falling: Secondary | ICD-10-CM | POA: Diagnosis not present

## 2021-04-19 DIAGNOSIS — R296 Repeated falls: Secondary | ICD-10-CM | POA: Diagnosis not present

## 2021-04-19 DIAGNOSIS — I10 Essential (primary) hypertension: Secondary | ICD-10-CM | POA: Diagnosis not present

## 2021-04-19 DIAGNOSIS — R413 Other amnesia: Secondary | ICD-10-CM | POA: Diagnosis not present

## 2021-04-19 DIAGNOSIS — M47812 Spondylosis without myelopathy or radiculopathy, cervical region: Secondary | ICD-10-CM | POA: Diagnosis not present

## 2021-04-19 DIAGNOSIS — R419 Unspecified symptoms and signs involving cognitive functions and awareness: Secondary | ICD-10-CM | POA: Diagnosis not present

## 2021-04-19 DIAGNOSIS — Z9181 History of falling: Secondary | ICD-10-CM | POA: Diagnosis not present

## 2021-04-23 DIAGNOSIS — I10 Essential (primary) hypertension: Secondary | ICD-10-CM | POA: Diagnosis not present

## 2021-04-23 DIAGNOSIS — R419 Unspecified symptoms and signs involving cognitive functions and awareness: Secondary | ICD-10-CM | POA: Diagnosis not present

## 2021-04-23 DIAGNOSIS — Z9181 History of falling: Secondary | ICD-10-CM | POA: Diagnosis not present

## 2021-04-23 DIAGNOSIS — R296 Repeated falls: Secondary | ICD-10-CM | POA: Diagnosis not present

## 2021-04-23 DIAGNOSIS — M47812 Spondylosis without myelopathy or radiculopathy, cervical region: Secondary | ICD-10-CM | POA: Diagnosis not present

## 2021-04-23 DIAGNOSIS — R413 Other amnesia: Secondary | ICD-10-CM | POA: Diagnosis not present

## 2021-04-24 DIAGNOSIS — E162 Hypoglycemia, unspecified: Secondary | ICD-10-CM | POA: Diagnosis not present

## 2021-04-24 DIAGNOSIS — E871 Hypo-osmolality and hyponatremia: Secondary | ICD-10-CM | POA: Diagnosis not present

## 2021-04-24 DIAGNOSIS — R419 Unspecified symptoms and signs involving cognitive functions and awareness: Secondary | ICD-10-CM | POA: Diagnosis not present

## 2021-04-24 DIAGNOSIS — I10 Essential (primary) hypertension: Secondary | ICD-10-CM | POA: Diagnosis not present

## 2021-04-24 DIAGNOSIS — R2243 Localized swelling, mass and lump, lower limb, bilateral: Secondary | ICD-10-CM | POA: Diagnosis not present

## 2021-04-24 DIAGNOSIS — Z9181 History of falling: Secondary | ICD-10-CM | POA: Diagnosis not present

## 2021-04-24 DIAGNOSIS — I5022 Chronic systolic (congestive) heart failure: Secondary | ICD-10-CM | POA: Diagnosis not present

## 2021-04-24 DIAGNOSIS — R296 Repeated falls: Secondary | ICD-10-CM | POA: Diagnosis not present

## 2021-04-24 DIAGNOSIS — R413 Other amnesia: Secondary | ICD-10-CM | POA: Diagnosis not present

## 2021-04-24 DIAGNOSIS — M47812 Spondylosis without myelopathy or radiculopathy, cervical region: Secondary | ICD-10-CM | POA: Diagnosis not present

## 2021-04-26 DIAGNOSIS — R413 Other amnesia: Secondary | ICD-10-CM | POA: Diagnosis not present

## 2021-04-26 DIAGNOSIS — R296 Repeated falls: Secondary | ICD-10-CM | POA: Diagnosis not present

## 2021-04-26 DIAGNOSIS — I10 Essential (primary) hypertension: Secondary | ICD-10-CM | POA: Diagnosis not present

## 2021-04-26 DIAGNOSIS — Z9181 History of falling: Secondary | ICD-10-CM | POA: Diagnosis not present

## 2021-04-26 DIAGNOSIS — M47812 Spondylosis without myelopathy or radiculopathy, cervical region: Secondary | ICD-10-CM | POA: Diagnosis not present

## 2021-04-26 DIAGNOSIS — R419 Unspecified symptoms and signs involving cognitive functions and awareness: Secondary | ICD-10-CM | POA: Diagnosis not present

## 2021-05-01 DIAGNOSIS — I10 Essential (primary) hypertension: Secondary | ICD-10-CM | POA: Diagnosis not present

## 2021-05-01 DIAGNOSIS — M47812 Spondylosis without myelopathy or radiculopathy, cervical region: Secondary | ICD-10-CM | POA: Diagnosis not present

## 2021-05-01 DIAGNOSIS — R2243 Localized swelling, mass and lump, lower limb, bilateral: Secondary | ICD-10-CM | POA: Diagnosis not present

## 2021-05-01 DIAGNOSIS — R419 Unspecified symptoms and signs involving cognitive functions and awareness: Secondary | ICD-10-CM | POA: Diagnosis not present

## 2021-05-01 DIAGNOSIS — R296 Repeated falls: Secondary | ICD-10-CM | POA: Diagnosis not present

## 2021-05-01 DIAGNOSIS — Z9181 History of falling: Secondary | ICD-10-CM | POA: Diagnosis not present

## 2021-05-01 DIAGNOSIS — R413 Other amnesia: Secondary | ICD-10-CM | POA: Diagnosis not present

## 2021-05-03 DIAGNOSIS — Z9181 History of falling: Secondary | ICD-10-CM | POA: Diagnosis not present

## 2021-05-03 DIAGNOSIS — R419 Unspecified symptoms and signs involving cognitive functions and awareness: Secondary | ICD-10-CM | POA: Diagnosis not present

## 2021-05-03 DIAGNOSIS — M47812 Spondylosis without myelopathy or radiculopathy, cervical region: Secondary | ICD-10-CM | POA: Diagnosis not present

## 2021-05-03 DIAGNOSIS — R296 Repeated falls: Secondary | ICD-10-CM | POA: Diagnosis not present

## 2021-05-03 DIAGNOSIS — I10 Essential (primary) hypertension: Secondary | ICD-10-CM | POA: Diagnosis not present

## 2021-05-03 DIAGNOSIS — R413 Other amnesia: Secondary | ICD-10-CM | POA: Diagnosis not present

## 2021-05-05 DIAGNOSIS — Z9181 History of falling: Secondary | ICD-10-CM | POA: Diagnosis not present

## 2021-05-05 DIAGNOSIS — R413 Other amnesia: Secondary | ICD-10-CM | POA: Diagnosis not present

## 2021-05-05 DIAGNOSIS — R419 Unspecified symptoms and signs involving cognitive functions and awareness: Secondary | ICD-10-CM | POA: Diagnosis not present

## 2021-05-05 DIAGNOSIS — R296 Repeated falls: Secondary | ICD-10-CM | POA: Diagnosis not present

## 2021-05-05 DIAGNOSIS — M47812 Spondylosis without myelopathy or radiculopathy, cervical region: Secondary | ICD-10-CM | POA: Diagnosis not present

## 2021-05-05 DIAGNOSIS — S81802D Unspecified open wound, left lower leg, subsequent encounter: Secondary | ICD-10-CM | POA: Diagnosis not present

## 2021-05-05 DIAGNOSIS — I10 Essential (primary) hypertension: Secondary | ICD-10-CM | POA: Diagnosis not present

## 2021-05-08 DIAGNOSIS — S81802D Unspecified open wound, left lower leg, subsequent encounter: Secondary | ICD-10-CM | POA: Diagnosis not present

## 2021-05-08 DIAGNOSIS — R413 Other amnesia: Secondary | ICD-10-CM | POA: Diagnosis not present

## 2021-05-08 DIAGNOSIS — R296 Repeated falls: Secondary | ICD-10-CM | POA: Diagnosis not present

## 2021-05-08 DIAGNOSIS — R419 Unspecified symptoms and signs involving cognitive functions and awareness: Secondary | ICD-10-CM | POA: Diagnosis not present

## 2021-05-08 DIAGNOSIS — M47812 Spondylosis without myelopathy or radiculopathy, cervical region: Secondary | ICD-10-CM | POA: Diagnosis not present

## 2021-05-08 DIAGNOSIS — I10 Essential (primary) hypertension: Secondary | ICD-10-CM | POA: Diagnosis not present

## 2021-05-09 DIAGNOSIS — M47812 Spondylosis without myelopathy or radiculopathy, cervical region: Secondary | ICD-10-CM | POA: Diagnosis not present

## 2021-05-09 DIAGNOSIS — I10 Essential (primary) hypertension: Secondary | ICD-10-CM | POA: Diagnosis not present

## 2021-05-09 DIAGNOSIS — R413 Other amnesia: Secondary | ICD-10-CM | POA: Diagnosis not present

## 2021-05-09 DIAGNOSIS — S81802D Unspecified open wound, left lower leg, subsequent encounter: Secondary | ICD-10-CM | POA: Diagnosis not present

## 2021-05-09 DIAGNOSIS — R296 Repeated falls: Secondary | ICD-10-CM | POA: Diagnosis not present

## 2021-05-09 DIAGNOSIS — R419 Unspecified symptoms and signs involving cognitive functions and awareness: Secondary | ICD-10-CM | POA: Diagnosis not present

## 2021-05-11 DIAGNOSIS — R419 Unspecified symptoms and signs involving cognitive functions and awareness: Secondary | ICD-10-CM | POA: Diagnosis not present

## 2021-05-11 DIAGNOSIS — R296 Repeated falls: Secondary | ICD-10-CM | POA: Diagnosis not present

## 2021-05-11 DIAGNOSIS — S81802D Unspecified open wound, left lower leg, subsequent encounter: Secondary | ICD-10-CM | POA: Diagnosis not present

## 2021-05-11 DIAGNOSIS — I10 Essential (primary) hypertension: Secondary | ICD-10-CM | POA: Diagnosis not present

## 2021-05-11 DIAGNOSIS — M47812 Spondylosis without myelopathy or radiculopathy, cervical region: Secondary | ICD-10-CM | POA: Diagnosis not present

## 2021-05-11 DIAGNOSIS — R413 Other amnesia: Secondary | ICD-10-CM | POA: Diagnosis not present

## 2021-05-15 DIAGNOSIS — R419 Unspecified symptoms and signs involving cognitive functions and awareness: Secondary | ICD-10-CM | POA: Diagnosis not present

## 2021-05-15 DIAGNOSIS — S81802D Unspecified open wound, left lower leg, subsequent encounter: Secondary | ICD-10-CM | POA: Diagnosis not present

## 2021-05-15 DIAGNOSIS — R413 Other amnesia: Secondary | ICD-10-CM | POA: Diagnosis not present

## 2021-05-15 DIAGNOSIS — I10 Essential (primary) hypertension: Secondary | ICD-10-CM | POA: Diagnosis not present

## 2021-05-15 DIAGNOSIS — M47812 Spondylosis without myelopathy or radiculopathy, cervical region: Secondary | ICD-10-CM | POA: Diagnosis not present

## 2021-05-15 DIAGNOSIS — R296 Repeated falls: Secondary | ICD-10-CM | POA: Diagnosis not present

## 2021-05-19 DIAGNOSIS — R296 Repeated falls: Secondary | ICD-10-CM | POA: Diagnosis not present

## 2021-05-19 DIAGNOSIS — R419 Unspecified symptoms and signs involving cognitive functions and awareness: Secondary | ICD-10-CM | POA: Diagnosis not present

## 2021-05-19 DIAGNOSIS — M47812 Spondylosis without myelopathy or radiculopathy, cervical region: Secondary | ICD-10-CM | POA: Diagnosis not present

## 2021-05-19 DIAGNOSIS — I10 Essential (primary) hypertension: Secondary | ICD-10-CM | POA: Diagnosis not present

## 2021-05-19 DIAGNOSIS — S81802D Unspecified open wound, left lower leg, subsequent encounter: Secondary | ICD-10-CM | POA: Diagnosis not present

## 2021-05-19 DIAGNOSIS — R413 Other amnesia: Secondary | ICD-10-CM | POA: Diagnosis not present

## 2021-05-21 DIAGNOSIS — R413 Other amnesia: Secondary | ICD-10-CM | POA: Diagnosis not present

## 2021-05-21 DIAGNOSIS — M47812 Spondylosis without myelopathy or radiculopathy, cervical region: Secondary | ICD-10-CM | POA: Diagnosis not present

## 2021-05-21 DIAGNOSIS — I10 Essential (primary) hypertension: Secondary | ICD-10-CM | POA: Diagnosis not present

## 2021-05-21 DIAGNOSIS — S81802D Unspecified open wound, left lower leg, subsequent encounter: Secondary | ICD-10-CM | POA: Diagnosis not present

## 2021-05-21 DIAGNOSIS — R296 Repeated falls: Secondary | ICD-10-CM | POA: Diagnosis not present

## 2021-05-21 DIAGNOSIS — R419 Unspecified symptoms and signs involving cognitive functions and awareness: Secondary | ICD-10-CM | POA: Diagnosis not present

## 2021-05-22 DIAGNOSIS — I10 Essential (primary) hypertension: Secondary | ICD-10-CM | POA: Diagnosis not present

## 2021-05-22 DIAGNOSIS — R413 Other amnesia: Secondary | ICD-10-CM | POA: Diagnosis not present

## 2021-05-22 DIAGNOSIS — M47812 Spondylosis without myelopathy or radiculopathy, cervical region: Secondary | ICD-10-CM | POA: Diagnosis not present

## 2021-05-22 DIAGNOSIS — R296 Repeated falls: Secondary | ICD-10-CM | POA: Diagnosis not present

## 2021-05-22 DIAGNOSIS — R419 Unspecified symptoms and signs involving cognitive functions and awareness: Secondary | ICD-10-CM | POA: Diagnosis not present

## 2021-05-22 DIAGNOSIS — S81802D Unspecified open wound, left lower leg, subsequent encounter: Secondary | ICD-10-CM | POA: Diagnosis not present

## 2021-05-29 DIAGNOSIS — R413 Other amnesia: Secondary | ICD-10-CM | POA: Diagnosis not present

## 2021-05-29 DIAGNOSIS — R419 Unspecified symptoms and signs involving cognitive functions and awareness: Secondary | ICD-10-CM | POA: Diagnosis not present

## 2021-05-29 DIAGNOSIS — S81802D Unspecified open wound, left lower leg, subsequent encounter: Secondary | ICD-10-CM | POA: Diagnosis not present

## 2021-05-29 DIAGNOSIS — R296 Repeated falls: Secondary | ICD-10-CM | POA: Diagnosis not present

## 2021-05-29 DIAGNOSIS — M47812 Spondylosis without myelopathy or radiculopathy, cervical region: Secondary | ICD-10-CM | POA: Diagnosis not present

## 2021-05-29 DIAGNOSIS — I10 Essential (primary) hypertension: Secondary | ICD-10-CM | POA: Diagnosis not present

## 2021-05-30 DIAGNOSIS — Z7409 Other reduced mobility: Secondary | ICD-10-CM | POA: Diagnosis not present

## 2021-05-30 DIAGNOSIS — I10 Essential (primary) hypertension: Secondary | ICD-10-CM | POA: Diagnosis not present

## 2021-05-30 DIAGNOSIS — Z515 Encounter for palliative care: Secondary | ICD-10-CM | POA: Diagnosis not present

## 2021-05-30 DIAGNOSIS — R413 Other amnesia: Secondary | ICD-10-CM | POA: Diagnosis not present

## 2021-06-04 DIAGNOSIS — I10 Essential (primary) hypertension: Secondary | ICD-10-CM | POA: Diagnosis not present

## 2021-06-04 DIAGNOSIS — R413 Other amnesia: Secondary | ICD-10-CM | POA: Diagnosis not present

## 2021-06-04 DIAGNOSIS — M47812 Spondylosis without myelopathy or radiculopathy, cervical region: Secondary | ICD-10-CM | POA: Diagnosis not present

## 2021-06-04 DIAGNOSIS — R296 Repeated falls: Secondary | ICD-10-CM | POA: Diagnosis not present

## 2021-06-04 DIAGNOSIS — Z9181 History of falling: Secondary | ICD-10-CM | POA: Diagnosis not present

## 2021-06-04 DIAGNOSIS — R419 Unspecified symptoms and signs involving cognitive functions and awareness: Secondary | ICD-10-CM | POA: Diagnosis not present

## 2021-06-04 DIAGNOSIS — S81802D Unspecified open wound, left lower leg, subsequent encounter: Secondary | ICD-10-CM | POA: Diagnosis not present

## 2021-06-05 DIAGNOSIS — S81802D Unspecified open wound, left lower leg, subsequent encounter: Secondary | ICD-10-CM | POA: Diagnosis not present

## 2021-06-05 DIAGNOSIS — M47812 Spondylosis without myelopathy or radiculopathy, cervical region: Secondary | ICD-10-CM | POA: Diagnosis not present

## 2021-06-05 DIAGNOSIS — R413 Other amnesia: Secondary | ICD-10-CM | POA: Diagnosis not present

## 2021-06-05 DIAGNOSIS — R296 Repeated falls: Secondary | ICD-10-CM | POA: Diagnosis not present

## 2021-06-05 DIAGNOSIS — I10 Essential (primary) hypertension: Secondary | ICD-10-CM | POA: Diagnosis not present

## 2021-06-05 DIAGNOSIS — R419 Unspecified symptoms and signs involving cognitive functions and awareness: Secondary | ICD-10-CM | POA: Diagnosis not present

## 2021-06-12 DIAGNOSIS — I10 Essential (primary) hypertension: Secondary | ICD-10-CM | POA: Diagnosis not present

## 2021-06-12 DIAGNOSIS — R413 Other amnesia: Secondary | ICD-10-CM | POA: Diagnosis not present

## 2021-06-12 DIAGNOSIS — R419 Unspecified symptoms and signs involving cognitive functions and awareness: Secondary | ICD-10-CM | POA: Diagnosis not present

## 2021-06-12 DIAGNOSIS — M47812 Spondylosis without myelopathy or radiculopathy, cervical region: Secondary | ICD-10-CM | POA: Diagnosis not present

## 2021-06-12 DIAGNOSIS — R296 Repeated falls: Secondary | ICD-10-CM | POA: Diagnosis not present

## 2021-06-12 DIAGNOSIS — S81802D Unspecified open wound, left lower leg, subsequent encounter: Secondary | ICD-10-CM | POA: Diagnosis not present

## 2021-06-13 DIAGNOSIS — R413 Other amnesia: Secondary | ICD-10-CM | POA: Diagnosis not present

## 2021-06-13 DIAGNOSIS — I10 Essential (primary) hypertension: Secondary | ICD-10-CM | POA: Diagnosis not present

## 2021-06-13 DIAGNOSIS — M47812 Spondylosis without myelopathy or radiculopathy, cervical region: Secondary | ICD-10-CM | POA: Diagnosis not present

## 2021-06-13 DIAGNOSIS — S81802D Unspecified open wound, left lower leg, subsequent encounter: Secondary | ICD-10-CM | POA: Diagnosis not present

## 2021-06-20 DIAGNOSIS — R296 Repeated falls: Secondary | ICD-10-CM | POA: Diagnosis not present

## 2021-06-20 DIAGNOSIS — M47812 Spondylosis without myelopathy or radiculopathy, cervical region: Secondary | ICD-10-CM | POA: Diagnosis not present

## 2021-06-20 DIAGNOSIS — I10 Essential (primary) hypertension: Secondary | ICD-10-CM | POA: Diagnosis not present

## 2021-06-20 DIAGNOSIS — R413 Other amnesia: Secondary | ICD-10-CM | POA: Diagnosis not present

## 2021-06-20 DIAGNOSIS — R419 Unspecified symptoms and signs involving cognitive functions and awareness: Secondary | ICD-10-CM | POA: Diagnosis not present

## 2021-06-20 DIAGNOSIS — S81802D Unspecified open wound, left lower leg, subsequent encounter: Secondary | ICD-10-CM | POA: Diagnosis not present

## 2021-06-21 DIAGNOSIS — I10 Essential (primary) hypertension: Secondary | ICD-10-CM | POA: Diagnosis not present

## 2021-06-21 DIAGNOSIS — S81802D Unspecified open wound, left lower leg, subsequent encounter: Secondary | ICD-10-CM | POA: Diagnosis not present

## 2021-06-21 DIAGNOSIS — R419 Unspecified symptoms and signs involving cognitive functions and awareness: Secondary | ICD-10-CM | POA: Diagnosis not present

## 2021-06-21 DIAGNOSIS — R413 Other amnesia: Secondary | ICD-10-CM | POA: Diagnosis not present

## 2021-06-21 DIAGNOSIS — M47812 Spondylosis without myelopathy or radiculopathy, cervical region: Secondary | ICD-10-CM | POA: Diagnosis not present

## 2021-06-21 DIAGNOSIS — R296 Repeated falls: Secondary | ICD-10-CM | POA: Diagnosis not present

## 2021-06-29 DIAGNOSIS — I10 Essential (primary) hypertension: Secondary | ICD-10-CM | POA: Diagnosis not present

## 2021-06-29 DIAGNOSIS — R413 Other amnesia: Secondary | ICD-10-CM | POA: Diagnosis not present

## 2021-06-29 DIAGNOSIS — S81802D Unspecified open wound, left lower leg, subsequent encounter: Secondary | ICD-10-CM | POA: Diagnosis not present

## 2021-06-29 DIAGNOSIS — R296 Repeated falls: Secondary | ICD-10-CM | POA: Diagnosis not present

## 2021-06-29 DIAGNOSIS — R419 Unspecified symptoms and signs involving cognitive functions and awareness: Secondary | ICD-10-CM | POA: Diagnosis not present

## 2021-06-29 DIAGNOSIS — M47812 Spondylosis without myelopathy or radiculopathy, cervical region: Secondary | ICD-10-CM | POA: Diagnosis not present

## 2021-07-03 DIAGNOSIS — R413 Other amnesia: Secondary | ICD-10-CM | POA: Diagnosis not present

## 2021-07-03 DIAGNOSIS — R419 Unspecified symptoms and signs involving cognitive functions and awareness: Secondary | ICD-10-CM | POA: Diagnosis not present

## 2021-07-03 DIAGNOSIS — I10 Essential (primary) hypertension: Secondary | ICD-10-CM | POA: Diagnosis not present

## 2021-07-03 DIAGNOSIS — M47812 Spondylosis without myelopathy or radiculopathy, cervical region: Secondary | ICD-10-CM | POA: Diagnosis not present

## 2021-07-03 DIAGNOSIS — S81802D Unspecified open wound, left lower leg, subsequent encounter: Secondary | ICD-10-CM | POA: Diagnosis not present

## 2021-07-03 DIAGNOSIS — R296 Repeated falls: Secondary | ICD-10-CM | POA: Diagnosis not present

## 2021-07-04 DIAGNOSIS — Z9181 History of falling: Secondary | ICD-10-CM | POA: Diagnosis not present

## 2021-07-04 DIAGNOSIS — M47812 Spondylosis without myelopathy or radiculopathy, cervical region: Secondary | ICD-10-CM | POA: Diagnosis not present

## 2021-07-04 DIAGNOSIS — R413 Other amnesia: Secondary | ICD-10-CM | POA: Diagnosis not present

## 2021-07-04 DIAGNOSIS — I10 Essential (primary) hypertension: Secondary | ICD-10-CM | POA: Diagnosis not present

## 2021-07-04 DIAGNOSIS — R296 Repeated falls: Secondary | ICD-10-CM | POA: Diagnosis not present

## 2021-07-05 DIAGNOSIS — R296 Repeated falls: Secondary | ICD-10-CM | POA: Diagnosis not present

## 2021-07-05 DIAGNOSIS — R413 Other amnesia: Secondary | ICD-10-CM | POA: Diagnosis not present

## 2021-07-05 DIAGNOSIS — M47812 Spondylosis without myelopathy or radiculopathy, cervical region: Secondary | ICD-10-CM | POA: Diagnosis not present

## 2021-07-05 DIAGNOSIS — I10 Essential (primary) hypertension: Secondary | ICD-10-CM | POA: Diagnosis not present

## 2021-07-05 DIAGNOSIS — Z9181 History of falling: Secondary | ICD-10-CM | POA: Diagnosis not present

## 2021-07-09 DIAGNOSIS — R413 Other amnesia: Secondary | ICD-10-CM | POA: Diagnosis not present

## 2021-07-09 DIAGNOSIS — M47812 Spondylosis without myelopathy or radiculopathy, cervical region: Secondary | ICD-10-CM | POA: Diagnosis not present

## 2021-07-09 DIAGNOSIS — I10 Essential (primary) hypertension: Secondary | ICD-10-CM | POA: Diagnosis not present

## 2021-07-09 DIAGNOSIS — R296 Repeated falls: Secondary | ICD-10-CM | POA: Diagnosis not present

## 2021-07-09 DIAGNOSIS — Z9181 History of falling: Secondary | ICD-10-CM | POA: Diagnosis not present

## 2021-07-12 DIAGNOSIS — R413 Other amnesia: Secondary | ICD-10-CM | POA: Diagnosis not present

## 2021-07-12 DIAGNOSIS — R296 Repeated falls: Secondary | ICD-10-CM | POA: Diagnosis not present

## 2021-07-12 DIAGNOSIS — M47812 Spondylosis without myelopathy or radiculopathy, cervical region: Secondary | ICD-10-CM | POA: Diagnosis not present

## 2021-07-12 DIAGNOSIS — I10 Essential (primary) hypertension: Secondary | ICD-10-CM | POA: Diagnosis not present

## 2021-07-16 DIAGNOSIS — R296 Repeated falls: Secondary | ICD-10-CM | POA: Diagnosis not present

## 2021-07-16 DIAGNOSIS — R413 Other amnesia: Secondary | ICD-10-CM | POA: Diagnosis not present

## 2021-07-16 DIAGNOSIS — Z9181 History of falling: Secondary | ICD-10-CM | POA: Diagnosis not present

## 2021-07-16 DIAGNOSIS — M47812 Spondylosis without myelopathy or radiculopathy, cervical region: Secondary | ICD-10-CM | POA: Diagnosis not present

## 2021-07-16 DIAGNOSIS — I10 Essential (primary) hypertension: Secondary | ICD-10-CM | POA: Diagnosis not present

## 2021-07-27 DIAGNOSIS — R413 Other amnesia: Secondary | ICD-10-CM | POA: Diagnosis not present

## 2021-07-27 DIAGNOSIS — R296 Repeated falls: Secondary | ICD-10-CM | POA: Diagnosis not present

## 2021-07-27 DIAGNOSIS — I10 Essential (primary) hypertension: Secondary | ICD-10-CM | POA: Diagnosis not present

## 2021-07-27 DIAGNOSIS — M47812 Spondylosis without myelopathy or radiculopathy, cervical region: Secondary | ICD-10-CM | POA: Diagnosis not present

## 2021-07-27 DIAGNOSIS — Z9181 History of falling: Secondary | ICD-10-CM | POA: Diagnosis not present

## 2021-08-02 DIAGNOSIS — R413 Other amnesia: Secondary | ICD-10-CM | POA: Diagnosis not present

## 2021-08-02 DIAGNOSIS — I10 Essential (primary) hypertension: Secondary | ICD-10-CM | POA: Diagnosis not present

## 2021-08-02 DIAGNOSIS — M47812 Spondylosis without myelopathy or radiculopathy, cervical region: Secondary | ICD-10-CM | POA: Diagnosis not present

## 2021-08-02 DIAGNOSIS — R296 Repeated falls: Secondary | ICD-10-CM | POA: Diagnosis not present

## 2021-08-02 DIAGNOSIS — Z9181 History of falling: Secondary | ICD-10-CM | POA: Diagnosis not present

## 2021-08-03 DIAGNOSIS — M47812 Spondylosis without myelopathy or radiculopathy, cervical region: Secondary | ICD-10-CM | POA: Diagnosis not present

## 2021-08-03 DIAGNOSIS — R296 Repeated falls: Secondary | ICD-10-CM | POA: Diagnosis not present

## 2021-08-03 DIAGNOSIS — R413 Other amnesia: Secondary | ICD-10-CM | POA: Diagnosis not present

## 2021-08-03 DIAGNOSIS — I10 Essential (primary) hypertension: Secondary | ICD-10-CM | POA: Diagnosis not present

## 2021-08-03 DIAGNOSIS — Z9181 History of falling: Secondary | ICD-10-CM | POA: Diagnosis not present

## 2021-08-14 DIAGNOSIS — R413 Other amnesia: Secondary | ICD-10-CM | POA: Diagnosis not present

## 2021-08-14 DIAGNOSIS — R296 Repeated falls: Secondary | ICD-10-CM | POA: Diagnosis not present

## 2021-08-14 DIAGNOSIS — Z9181 History of falling: Secondary | ICD-10-CM | POA: Diagnosis not present

## 2021-08-14 DIAGNOSIS — I10 Essential (primary) hypertension: Secondary | ICD-10-CM | POA: Diagnosis not present

## 2021-08-14 DIAGNOSIS — M47812 Spondylosis without myelopathy or radiculopathy, cervical region: Secondary | ICD-10-CM | POA: Diagnosis not present

## 2021-08-22 DIAGNOSIS — I10 Essential (primary) hypertension: Secondary | ICD-10-CM | POA: Diagnosis not present

## 2021-08-22 DIAGNOSIS — M47812 Spondylosis without myelopathy or radiculopathy, cervical region: Secondary | ICD-10-CM | POA: Diagnosis not present

## 2021-08-22 DIAGNOSIS — Z9181 History of falling: Secondary | ICD-10-CM | POA: Diagnosis not present

## 2021-08-22 DIAGNOSIS — R413 Other amnesia: Secondary | ICD-10-CM | POA: Diagnosis not present

## 2021-08-22 DIAGNOSIS — R296 Repeated falls: Secondary | ICD-10-CM | POA: Diagnosis not present

## 2021-08-28 DIAGNOSIS — M47812 Spondylosis without myelopathy or radiculopathy, cervical region: Secondary | ICD-10-CM | POA: Diagnosis not present

## 2021-08-28 DIAGNOSIS — Z9181 History of falling: Secondary | ICD-10-CM | POA: Diagnosis not present

## 2021-08-28 DIAGNOSIS — R413 Other amnesia: Secondary | ICD-10-CM | POA: Diagnosis not present

## 2021-08-28 DIAGNOSIS — I10 Essential (primary) hypertension: Secondary | ICD-10-CM | POA: Diagnosis not present

## 2021-08-28 DIAGNOSIS — R296 Repeated falls: Secondary | ICD-10-CM | POA: Diagnosis not present

## 2021-09-02 DIAGNOSIS — M47812 Spondylosis without myelopathy or radiculopathy, cervical region: Secondary | ICD-10-CM | POA: Diagnosis not present

## 2021-09-02 DIAGNOSIS — I10 Essential (primary) hypertension: Secondary | ICD-10-CM | POA: Diagnosis not present

## 2021-09-02 DIAGNOSIS — R413 Other amnesia: Secondary | ICD-10-CM | POA: Diagnosis not present

## 2021-09-02 DIAGNOSIS — R296 Repeated falls: Secondary | ICD-10-CM | POA: Diagnosis not present

## 2021-09-02 DIAGNOSIS — Z9181 History of falling: Secondary | ICD-10-CM | POA: Diagnosis not present

## 2021-09-04 DIAGNOSIS — Z9181 History of falling: Secondary | ICD-10-CM | POA: Diagnosis not present

## 2021-09-04 DIAGNOSIS — R296 Repeated falls: Secondary | ICD-10-CM | POA: Diagnosis not present

## 2021-09-04 DIAGNOSIS — I10 Essential (primary) hypertension: Secondary | ICD-10-CM | POA: Diagnosis not present

## 2021-09-04 DIAGNOSIS — R413 Other amnesia: Secondary | ICD-10-CM | POA: Diagnosis not present

## 2021-09-04 DIAGNOSIS — M47812 Spondylosis without myelopathy or radiculopathy, cervical region: Secondary | ICD-10-CM | POA: Diagnosis not present

## 2021-09-10 DIAGNOSIS — I10 Essential (primary) hypertension: Secondary | ICD-10-CM | POA: Diagnosis not present

## 2021-09-10 DIAGNOSIS — M47812 Spondylosis without myelopathy or radiculopathy, cervical region: Secondary | ICD-10-CM | POA: Diagnosis not present

## 2021-09-10 DIAGNOSIS — R296 Repeated falls: Secondary | ICD-10-CM | POA: Diagnosis not present

## 2021-09-10 DIAGNOSIS — Z9181 History of falling: Secondary | ICD-10-CM | POA: Diagnosis not present

## 2021-09-10 DIAGNOSIS — R413 Other amnesia: Secondary | ICD-10-CM | POA: Diagnosis not present

## 2021-09-14 DIAGNOSIS — M47812 Spondylosis without myelopathy or radiculopathy, cervical region: Secondary | ICD-10-CM | POA: Diagnosis not present

## 2021-09-14 DIAGNOSIS — I10 Essential (primary) hypertension: Secondary | ICD-10-CM | POA: Diagnosis not present

## 2021-09-14 DIAGNOSIS — R413 Other amnesia: Secondary | ICD-10-CM | POA: Diagnosis not present

## 2021-09-14 DIAGNOSIS — R296 Repeated falls: Secondary | ICD-10-CM | POA: Diagnosis not present

## 2021-09-17 DIAGNOSIS — I10 Essential (primary) hypertension: Secondary | ICD-10-CM | POA: Diagnosis not present

## 2021-09-17 DIAGNOSIS — Z9181 History of falling: Secondary | ICD-10-CM | POA: Diagnosis not present

## 2021-09-17 DIAGNOSIS — R413 Other amnesia: Secondary | ICD-10-CM | POA: Diagnosis not present

## 2021-09-17 DIAGNOSIS — R296 Repeated falls: Secondary | ICD-10-CM | POA: Diagnosis not present

## 2021-09-17 DIAGNOSIS — M47812 Spondylosis without myelopathy or radiculopathy, cervical region: Secondary | ICD-10-CM | POA: Diagnosis not present

## 2021-09-27 DIAGNOSIS — M47812 Spondylosis without myelopathy or radiculopathy, cervical region: Secondary | ICD-10-CM | POA: Diagnosis not present

## 2021-09-27 DIAGNOSIS — R296 Repeated falls: Secondary | ICD-10-CM | POA: Diagnosis not present

## 2021-09-27 DIAGNOSIS — I10 Essential (primary) hypertension: Secondary | ICD-10-CM | POA: Diagnosis not present

## 2021-09-27 DIAGNOSIS — R413 Other amnesia: Secondary | ICD-10-CM | POA: Diagnosis not present

## 2021-09-27 DIAGNOSIS — Z9181 History of falling: Secondary | ICD-10-CM | POA: Diagnosis not present

## 2021-10-01 DIAGNOSIS — I10 Essential (primary) hypertension: Secondary | ICD-10-CM | POA: Diagnosis not present

## 2021-10-01 DIAGNOSIS — R413 Other amnesia: Secondary | ICD-10-CM | POA: Diagnosis not present

## 2021-10-01 DIAGNOSIS — R296 Repeated falls: Secondary | ICD-10-CM | POA: Diagnosis not present

## 2021-10-01 DIAGNOSIS — M47812 Spondylosis without myelopathy or radiculopathy, cervical region: Secondary | ICD-10-CM | POA: Diagnosis not present

## 2021-10-01 DIAGNOSIS — Z9181 History of falling: Secondary | ICD-10-CM | POA: Diagnosis not present

## 2021-10-02 DIAGNOSIS — R413 Other amnesia: Secondary | ICD-10-CM | POA: Diagnosis not present

## 2021-10-02 DIAGNOSIS — M47812 Spondylosis without myelopathy or radiculopathy, cervical region: Secondary | ICD-10-CM | POA: Diagnosis not present

## 2021-10-02 DIAGNOSIS — R296 Repeated falls: Secondary | ICD-10-CM | POA: Diagnosis not present

## 2021-10-02 DIAGNOSIS — Z9181 History of falling: Secondary | ICD-10-CM | POA: Diagnosis not present

## 2021-10-02 DIAGNOSIS — I10 Essential (primary) hypertension: Secondary | ICD-10-CM | POA: Diagnosis not present

## 2021-10-10 DIAGNOSIS — R296 Repeated falls: Secondary | ICD-10-CM | POA: Diagnosis not present

## 2021-10-10 DIAGNOSIS — R413 Other amnesia: Secondary | ICD-10-CM | POA: Diagnosis not present

## 2021-10-10 DIAGNOSIS — M47812 Spondylosis without myelopathy or radiculopathy, cervical region: Secondary | ICD-10-CM | POA: Diagnosis not present

## 2021-10-10 DIAGNOSIS — Z9181 History of falling: Secondary | ICD-10-CM | POA: Diagnosis not present

## 2021-10-10 DIAGNOSIS — I10 Essential (primary) hypertension: Secondary | ICD-10-CM | POA: Diagnosis not present

## 2021-10-15 DIAGNOSIS — R413 Other amnesia: Secondary | ICD-10-CM | POA: Diagnosis not present

## 2021-10-15 DIAGNOSIS — Z9181 History of falling: Secondary | ICD-10-CM | POA: Diagnosis not present

## 2021-10-15 DIAGNOSIS — I10 Essential (primary) hypertension: Secondary | ICD-10-CM | POA: Diagnosis not present

## 2021-10-15 DIAGNOSIS — R296 Repeated falls: Secondary | ICD-10-CM | POA: Diagnosis not present

## 2021-10-15 DIAGNOSIS — M47812 Spondylosis without myelopathy or radiculopathy, cervical region: Secondary | ICD-10-CM | POA: Diagnosis not present

## 2021-10-23 DIAGNOSIS — R413 Other amnesia: Secondary | ICD-10-CM | POA: Diagnosis not present

## 2021-10-23 DIAGNOSIS — R296 Repeated falls: Secondary | ICD-10-CM | POA: Diagnosis not present

## 2021-10-23 DIAGNOSIS — I10 Essential (primary) hypertension: Secondary | ICD-10-CM | POA: Diagnosis not present

## 2021-10-23 DIAGNOSIS — Z9181 History of falling: Secondary | ICD-10-CM | POA: Diagnosis not present

## 2021-10-23 DIAGNOSIS — M47812 Spondylosis without myelopathy or radiculopathy, cervical region: Secondary | ICD-10-CM | POA: Diagnosis not present

## 2021-10-30 DIAGNOSIS — R296 Repeated falls: Secondary | ICD-10-CM | POA: Diagnosis not present

## 2021-10-30 DIAGNOSIS — M47812 Spondylosis without myelopathy or radiculopathy, cervical region: Secondary | ICD-10-CM | POA: Diagnosis not present

## 2021-10-30 DIAGNOSIS — R413 Other amnesia: Secondary | ICD-10-CM | POA: Diagnosis not present

## 2021-10-30 DIAGNOSIS — I10 Essential (primary) hypertension: Secondary | ICD-10-CM | POA: Diagnosis not present

## 2021-10-30 DIAGNOSIS — Z9181 History of falling: Secondary | ICD-10-CM | POA: Diagnosis not present

## 2022-07-24 ENCOUNTER — Telehealth: Payer: Self-pay

## 2022-07-24 NOTE — Patient Outreach (Signed)
  Care Coordination   Initial Visit Note   07/24/2022 Name: Heidi Daniel MRN: [id number] DOB: [birth year]  Heidi Daniel is a 89 y.o. year old female who sees Georgianne Fick, MD for primary care. I  spoke with son Onalee Hua by phone today.  What matters to the patients health and wellness today?  Getting patient PCP services for medications.    Goals Addressed             This Visit's Progress    COMPLETED: PCP services       Care Coordination Interventions: Advised patient to Annual Wellness exam. Discussed [facility] services and support. Assessed SDOH. Advised to discuss with primary care physician if services needed in the future.  Spoke with nurse at Dr. Carolyn Stare office about patient. Patient unable to get out of the home to appointments. Discussed possible [facility]. Advised that CM would call son.  Spoke with son Onalee Hua.  He states it is just too much for patient to get out of the house and that the last time patient fell.  Discussed possible [facility] to come in the home to see patient. He is agreeable.  Information given on [facility].  He voices no other concerns.         SDOH assessments and interventions completed:  Yes  SDOH Interventions Today    Flowsheet Row Most Recent Value  SDOH Interventions   Housing Interventions Intervention Not Indicated  Utilities Interventions Intervention Not Indicated        Care Coordination Interventions:  Yes, provided   Follow up plan: No further intervention required.   Encounter Outcome:  Pt. Visit Completed   Bary Leriche, RN, MSN [facility] Care Management Care Management Coordinator Direct Line [phone]

## 2022-07-24 NOTE — Patient Instructions (Signed)
Visit Information  Thank you for taking time to visit with me today. Please don't hesitate to contact me if I can be of assistance to you.   Following are the goals we discussed today:   Goals Addressed             This Visit's Progress    COMPLETED: PCP services       Care Coordination Interventions: Advised patient to Annual Wellness exam. Discussed [facility] services and support. Assessed SDOH. Advised to discuss with primary care physician if services needed in the future.  Spoke with nurse at Dr. Carolyn Stare office about patient. Patient unable to get out of the home to appointments. Patient has not been seen in some time and needs to be seen for routine care and refills.  Discussed possible [facility]. Advised that CM would call son.  Spoke with son Onalee Hua.  He states it is just too much for patient to get out of the house and that the last time patient fell.  Discussed possible [facility] to come in the home to see patient. He is agreeable.  Information given on [facility].  He voices no other concerns.          If you are experiencing a Mental Health or Behavioral Health Crisis or need someone to talk to, please call the Suicide and Crisis Lifeline: 988   The patient verbalized understanding of instructions, educational materials, and care plan provided today and DECLINED offer to receive copy of patient instructions, educational materials, and care plan.   The patient has been provided with contact information for the care management team and has been advised to call with any health related questions or concerns.   Bary Leriche, RN, MSN [facility] Care Management Care Management Coordinator Direct Line [phone]

## 2022-08-21 DIAGNOSIS — H9190 Unspecified hearing loss, unspecified ear: Secondary | ICD-10-CM | POA: Diagnosis not present

## 2022-08-21 DIAGNOSIS — Z87440 Personal history of urinary (tract) infections: Secondary | ICD-10-CM | POA: Diagnosis not present

## 2022-08-21 DIAGNOSIS — I10 Essential (primary) hypertension: Secondary | ICD-10-CM | POA: Diagnosis not present

## 2022-08-21 DIAGNOSIS — E079 Disorder of thyroid, unspecified: Secondary | ICD-10-CM | POA: Diagnosis not present

## 2022-08-28 DIAGNOSIS — R54 Age-related physical debility: Secondary | ICD-10-CM | POA: Diagnosis not present

## 2022-08-28 DIAGNOSIS — I10 Essential (primary) hypertension: Secondary | ICD-10-CM | POA: Diagnosis not present

## 2022-08-28 DIAGNOSIS — H9190 Unspecified hearing loss, unspecified ear: Secondary | ICD-10-CM | POA: Diagnosis not present

## 2022-09-06 DIAGNOSIS — Z0001 Encounter for general adult medical examination with abnormal findings: Secondary | ICD-10-CM | POA: Diagnosis not present

## 2022-09-06 DIAGNOSIS — E079 Disorder of thyroid, unspecified: Secondary | ICD-10-CM | POA: Diagnosis not present

## 2022-09-06 DIAGNOSIS — I10 Essential (primary) hypertension: Secondary | ICD-10-CM | POA: Diagnosis not present

## 2022-09-06 DIAGNOSIS — R54 Age-related physical debility: Secondary | ICD-10-CM | POA: Diagnosis not present

## 2022-09-06 DIAGNOSIS — H6123 Impacted cerumen, bilateral: Secondary | ICD-10-CM | POA: Diagnosis not present

## 2022-10-04 DIAGNOSIS — H9190 Unspecified hearing loss, unspecified ear: Secondary | ICD-10-CM | POA: Diagnosis not present

## 2022-10-04 DIAGNOSIS — I10 Essential (primary) hypertension: Secondary | ICD-10-CM | POA: Diagnosis not present

## 2022-10-04 DIAGNOSIS — R54 Age-related physical debility: Secondary | ICD-10-CM | POA: Diagnosis not present

## 2022-10-04 DIAGNOSIS — E079 Disorder of thyroid, unspecified: Secondary | ICD-10-CM | POA: Diagnosis not present

## 2022-11-08 DIAGNOSIS — H9190 Unspecified hearing loss, unspecified ear: Secondary | ICD-10-CM | POA: Diagnosis not present

## 2022-11-08 DIAGNOSIS — R54 Age-related physical debility: Secondary | ICD-10-CM | POA: Diagnosis not present

## 2022-11-08 DIAGNOSIS — R609 Edema, unspecified: Secondary | ICD-10-CM | POA: Diagnosis not present

## 2022-11-08 DIAGNOSIS — E079 Disorder of thyroid, unspecified: Secondary | ICD-10-CM | POA: Diagnosis not present

## 2022-11-08 DIAGNOSIS — I10 Essential (primary) hypertension: Secondary | ICD-10-CM | POA: Diagnosis not present

## 2022-12-09 IMAGING — CT CT CERVICAL SPINE W/O CM
3 of 4 series · 13 of 33 positions shown, 16 images · non-contrast
Comparison: February 22, 2020.

CLINICAL DATA: Acute neck pain without known injury.



[Series 6: orthogonal bone · axial · 0.23mm/px · z∈[-241,-129]mm · 5 of 93 slices shown, 7 images]
[im 16/93  soft-tissue]
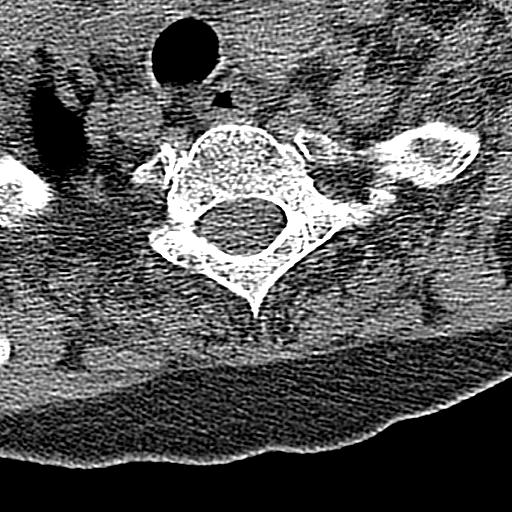
[im 16/93  bone]
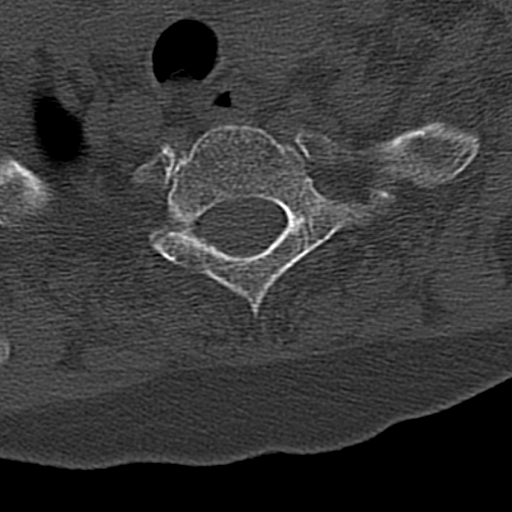
[im 31/93  bone]
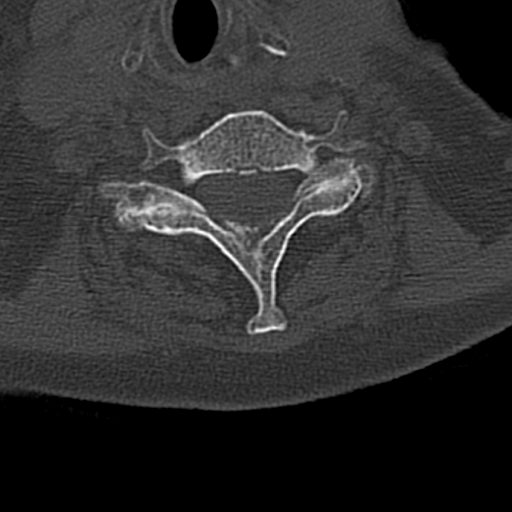
[im 47/93  bone]
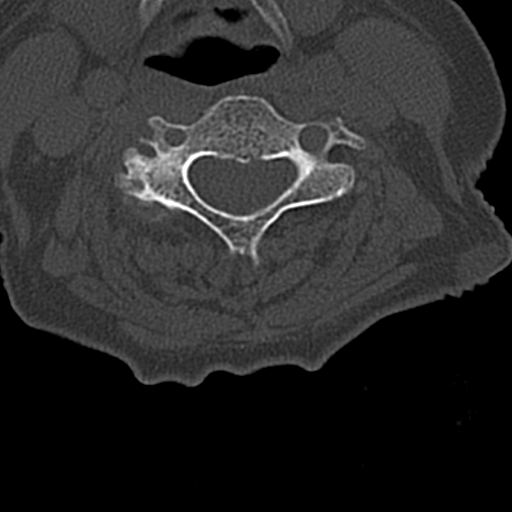
[im 62/93  bone]
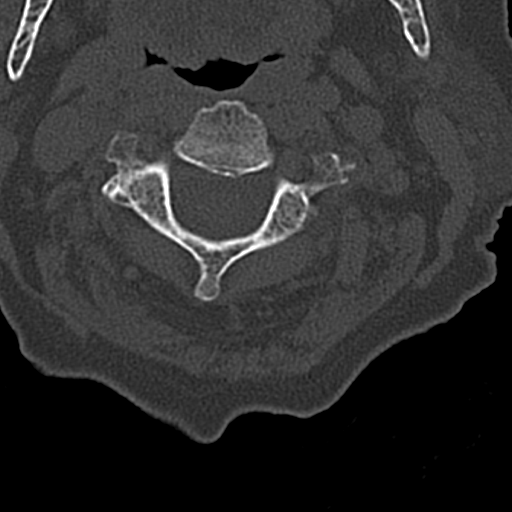
[im 77/93  soft-tissue]
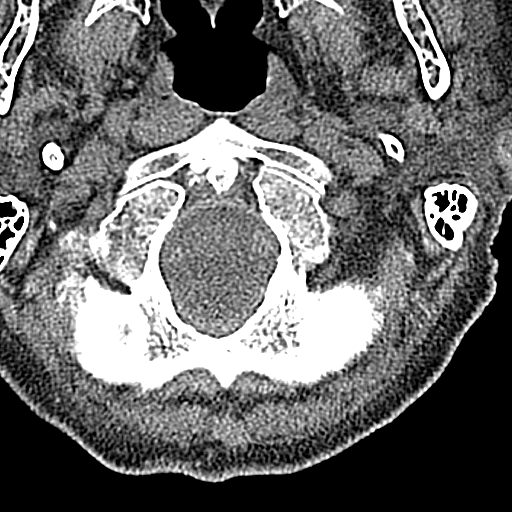
[im 77/93  bone]
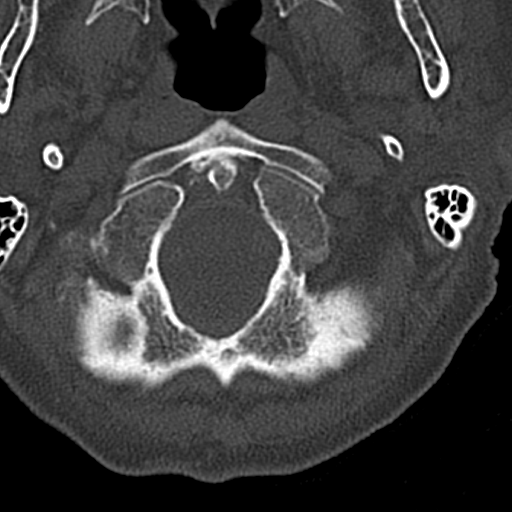

[Series 7: coronal bone · coronal · 0.28mm/px · 3 of 45 slices shown]
[im 9/45  bone]
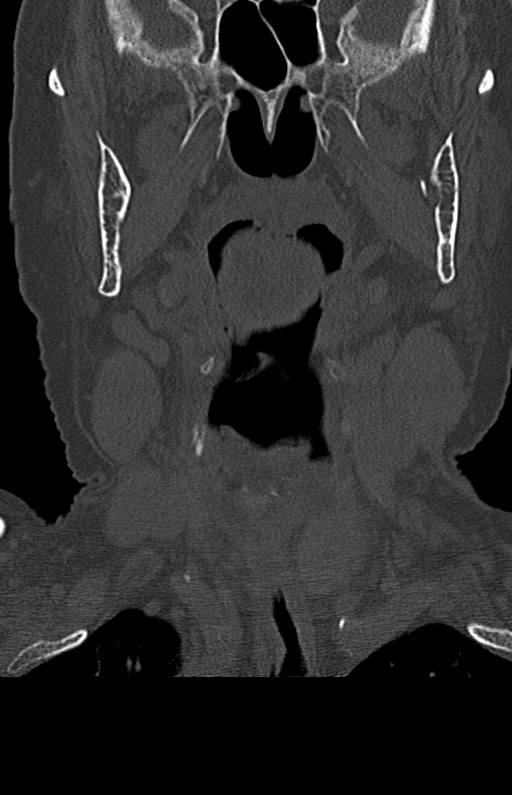
[im 18/45  bone]
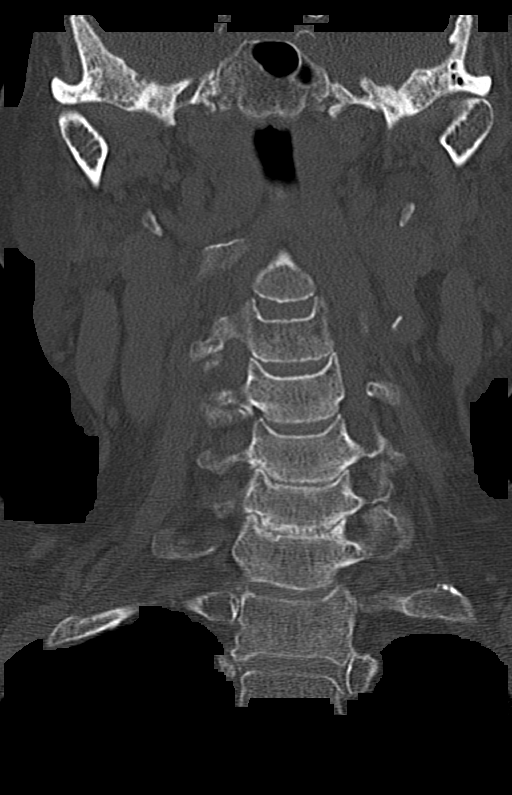
[im 27/45  bone]
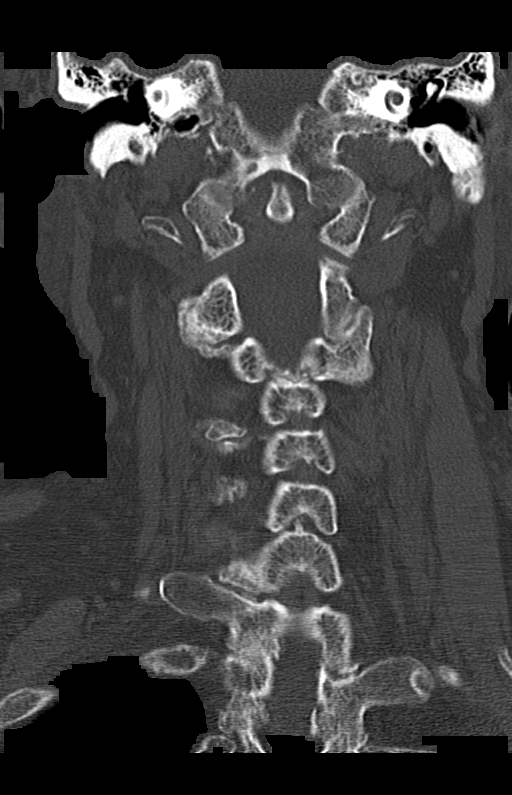

[Series 8: sagittal bone · sagittal · 0.31mm/px · 5 of 48 slices shown, 6 images]
[im 16/48  bone]
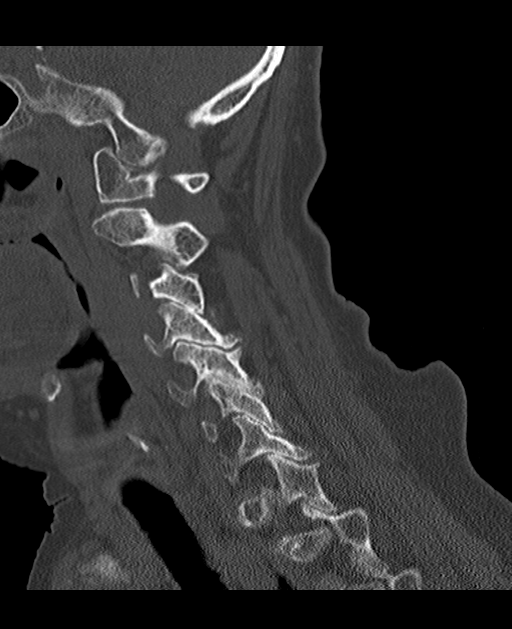
[im 20/48  bone]
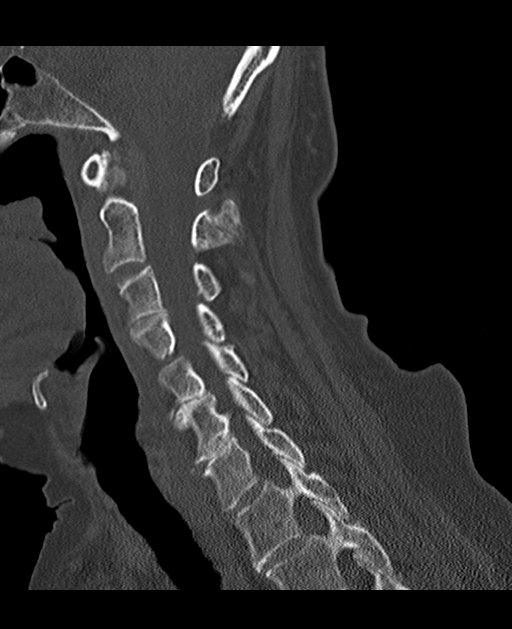
[im 24/48  soft-tissue]
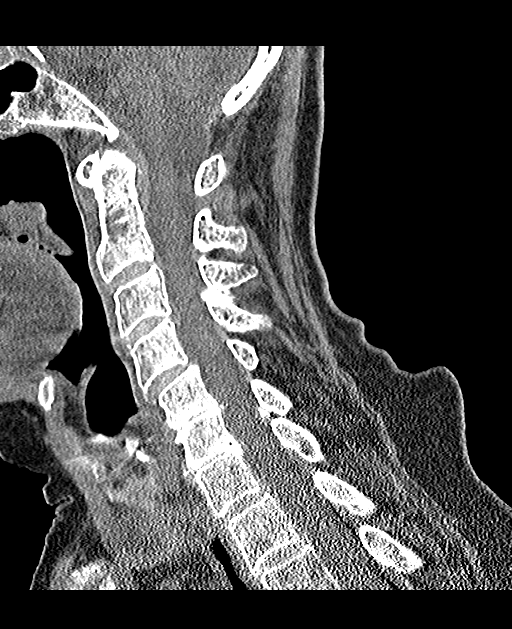
[im 24/48  bone]
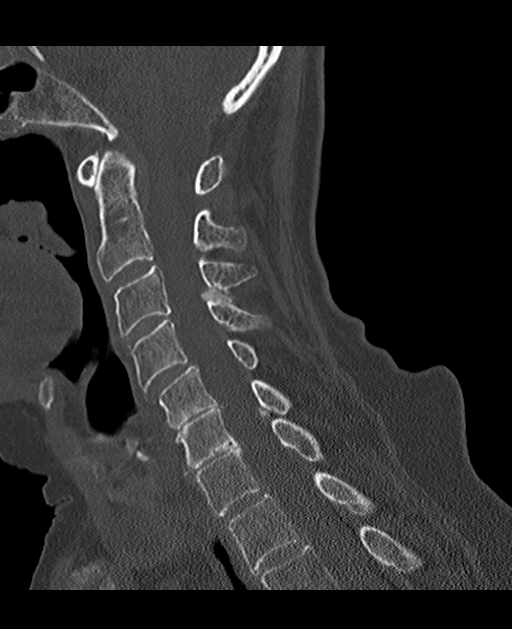
[im 28/48  bone]
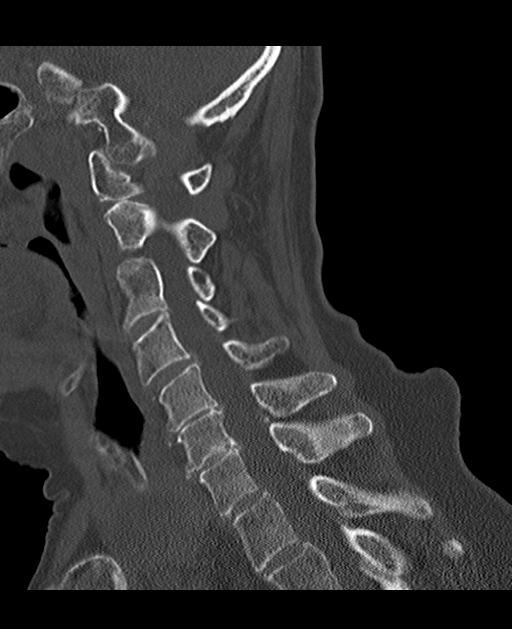
[im 32/48  bone]
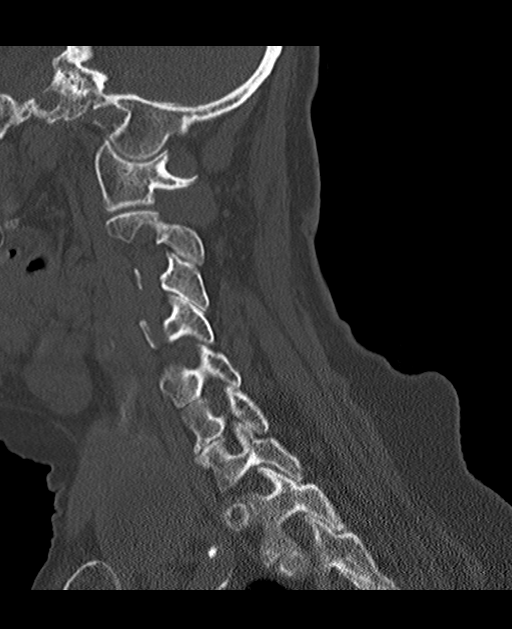

[13 of 33 positions shown; findings below may reference images not displayed]

FINDINGS: Alignment: Mild grade 1 anterolisthesis of C4-5 is noted secondary
to posterior facet joint hypertrophy.

Skull base and vertebrae: No acute fracture. No primary bone lesion
or focal pathologic process.

Soft tissues and spinal canal: No prevertebral fluid or swelling. No
visible canal hematoma.

Disc levels: Severe degenerative disc disease is noted at C5-6 and
C6-7.

Upper chest: Negative.

Other: Severe degenerative changes are seen involving the
right-sided posterior facet joints.
IMPRESSION: Severe multilevel degenerative disc disease. No acute abnormality
seen in the cervical spine.

## 2022-12-13 DIAGNOSIS — H9190 Unspecified hearing loss, unspecified ear: Secondary | ICD-10-CM | POA: Diagnosis not present

## 2022-12-13 DIAGNOSIS — I10 Essential (primary) hypertension: Secondary | ICD-10-CM | POA: Diagnosis not present

## 2022-12-13 DIAGNOSIS — R54 Age-related physical debility: Secondary | ICD-10-CM | POA: Diagnosis not present

## 2022-12-13 DIAGNOSIS — Z23 Encounter for immunization: Secondary | ICD-10-CM | POA: Diagnosis not present

## 2022-12-30 DIAGNOSIS — I10 Essential (primary) hypertension: Secondary | ICD-10-CM | POA: Diagnosis not present

## 2022-12-30 DIAGNOSIS — H9190 Unspecified hearing loss, unspecified ear: Secondary | ICD-10-CM | POA: Diagnosis not present

## 2022-12-30 DIAGNOSIS — R54 Age-related physical debility: Secondary | ICD-10-CM | POA: Diagnosis not present

## 2023-01-22 DIAGNOSIS — H9190 Unspecified hearing loss, unspecified ear: Secondary | ICD-10-CM | POA: Diagnosis not present

## 2023-01-22 DIAGNOSIS — I10 Essential (primary) hypertension: Secondary | ICD-10-CM | POA: Diagnosis not present

## 2023-01-22 DIAGNOSIS — R54 Age-related physical debility: Secondary | ICD-10-CM | POA: Diagnosis not present

## 2023-02-07 DIAGNOSIS — R54 Age-related physical debility: Secondary | ICD-10-CM | POA: Diagnosis not present

## 2023-02-07 DIAGNOSIS — I10 Essential (primary) hypertension: Secondary | ICD-10-CM | POA: Diagnosis not present

## 2023-02-11 DIAGNOSIS — I10 Essential (primary) hypertension: Secondary | ICD-10-CM | POA: Diagnosis not present

## 2023-02-11 DIAGNOSIS — H9190 Unspecified hearing loss, unspecified ear: Secondary | ICD-10-CM | POA: Diagnosis not present

## 2023-02-11 DIAGNOSIS — R54 Age-related physical debility: Secondary | ICD-10-CM | POA: Diagnosis not present

## 2023-04-03 DIAGNOSIS — R54 Age-related physical debility: Secondary | ICD-10-CM | POA: Diagnosis not present

## 2023-04-03 DIAGNOSIS — H9190 Unspecified hearing loss, unspecified ear: Secondary | ICD-10-CM | POA: Diagnosis not present

## 2023-04-03 DIAGNOSIS — I10 Essential (primary) hypertension: Secondary | ICD-10-CM | POA: Diagnosis not present

## 2023-04-08 DIAGNOSIS — I10 Essential (primary) hypertension: Secondary | ICD-10-CM | POA: Diagnosis not present

## 2023-04-08 DIAGNOSIS — R54 Age-related physical debility: Secondary | ICD-10-CM | POA: Diagnosis not present

## 2023-05-05 DIAGNOSIS — I10 Essential (primary) hypertension: Secondary | ICD-10-CM | POA: Diagnosis not present

## 2023-05-05 DIAGNOSIS — R54 Age-related physical debility: Secondary | ICD-10-CM | POA: Diagnosis not present

## 2023-05-05 DIAGNOSIS — H9190 Unspecified hearing loss, unspecified ear: Secondary | ICD-10-CM | POA: Diagnosis not present

## 2023-05-13 DIAGNOSIS — H9190 Unspecified hearing loss, unspecified ear: Secondary | ICD-10-CM | POA: Diagnosis not present

## 2023-05-13 DIAGNOSIS — R54 Age-related physical debility: Secondary | ICD-10-CM | POA: Diagnosis not present

## 2023-05-13 DIAGNOSIS — I10 Essential (primary) hypertension: Secondary | ICD-10-CM | POA: Diagnosis not present

## 2023-06-10 DIAGNOSIS — R54 Age-related physical debility: Secondary | ICD-10-CM | POA: Diagnosis not present

## 2023-06-10 DIAGNOSIS — I10 Essential (primary) hypertension: Secondary | ICD-10-CM | POA: Diagnosis not present

## 2023-07-04 DIAGNOSIS — R54 Age-related physical debility: Secondary | ICD-10-CM | POA: Diagnosis not present

## 2023-07-04 DIAGNOSIS — I10 Essential (primary) hypertension: Secondary | ICD-10-CM | POA: Diagnosis not present

## 2023-07-04 DIAGNOSIS — H9190 Unspecified hearing loss, unspecified ear: Secondary | ICD-10-CM | POA: Diagnosis not present

## 2023-07-29 DIAGNOSIS — H9190 Unspecified hearing loss, unspecified ear: Secondary | ICD-10-CM | POA: Diagnosis not present

## 2023-07-29 DIAGNOSIS — R54 Age-related physical debility: Secondary | ICD-10-CM | POA: Diagnosis not present

## 2023-07-29 DIAGNOSIS — I10 Essential (primary) hypertension: Secondary | ICD-10-CM | POA: Diagnosis not present

## 2023-08-12 DIAGNOSIS — H9190 Unspecified hearing loss, unspecified ear: Secondary | ICD-10-CM | POA: Diagnosis not present

## 2023-08-12 DIAGNOSIS — I10 Essential (primary) hypertension: Secondary | ICD-10-CM | POA: Diagnosis not present

## 2023-08-27 DIAGNOSIS — H9190 Unspecified hearing loss, unspecified ear: Secondary | ICD-10-CM | POA: Diagnosis not present

## 2023-08-27 DIAGNOSIS — R54 Age-related physical debility: Secondary | ICD-10-CM | POA: Diagnosis not present

## 2023-08-27 DIAGNOSIS — I10 Essential (primary) hypertension: Secondary | ICD-10-CM | POA: Diagnosis not present

## 2023-09-10 DIAGNOSIS — I10 Essential (primary) hypertension: Secondary | ICD-10-CM | POA: Diagnosis not present

## 2023-09-10 DIAGNOSIS — H9190 Unspecified hearing loss, unspecified ear: Secondary | ICD-10-CM | POA: Diagnosis not present

## 2023-09-10 DIAGNOSIS — R54 Age-related physical debility: Secondary | ICD-10-CM | POA: Diagnosis not present

## 2023-09-11 DIAGNOSIS — Z139 Encounter for screening, unspecified: Secondary | ICD-10-CM | POA: Diagnosis not present

## 2023-09-11 DIAGNOSIS — Z0000 Encounter for general adult medical examination without abnormal findings: Secondary | ICD-10-CM | POA: Diagnosis not present

## 2023-10-07 DIAGNOSIS — Z79899 Other long term (current) drug therapy: Secondary | ICD-10-CM | POA: Diagnosis not present

## 2023-10-07 DIAGNOSIS — I10 Essential (primary) hypertension: Secondary | ICD-10-CM | POA: Diagnosis not present

## 2023-10-07 DIAGNOSIS — R54 Age-related physical debility: Secondary | ICD-10-CM | POA: Diagnosis not present

## 2023-10-07 DIAGNOSIS — H9190 Unspecified hearing loss, unspecified ear: Secondary | ICD-10-CM | POA: Diagnosis not present

## 2023-12-29 DIAGNOSIS — R54 Age-related physical debility: Secondary | ICD-10-CM | POA: Diagnosis not present

## 2023-12-29 DIAGNOSIS — H9190 Unspecified hearing loss, unspecified ear: Secondary | ICD-10-CM | POA: Diagnosis not present

## 2023-12-29 DIAGNOSIS — I10 Essential (primary) hypertension: Secondary | ICD-10-CM | POA: Diagnosis not present

## 2023-12-30 DIAGNOSIS — I10 Essential (primary) hypertension: Secondary | ICD-10-CM | POA: Diagnosis not present

## 2023-12-30 DIAGNOSIS — J309 Allergic rhinitis, unspecified: Secondary | ICD-10-CM | POA: Diagnosis not present
